# Patient Record
Sex: Female | Born: 1945 | Race: White | Hispanic: No | Smoking: Former smoker
Health system: Southern US, Community
[De-identification: ages and names within clinical notes are randomized; demographics above are authoritative.]

## PROBLEM LIST (undated history)

## (undated) DIAGNOSIS — K219 Gastro-esophageal reflux disease without esophagitis: Secondary | ICD-10-CM

## (undated) DIAGNOSIS — A809 Acute poliomyelitis, unspecified: Secondary | ICD-10-CM

## (undated) DIAGNOSIS — E119 Type 2 diabetes mellitus without complications: Secondary | ICD-10-CM

## (undated) DIAGNOSIS — C801 Malignant (primary) neoplasm, unspecified: Secondary | ICD-10-CM

## (undated) DIAGNOSIS — M1990 Unspecified osteoarthritis, unspecified site: Secondary | ICD-10-CM

## (undated) DIAGNOSIS — R1310 Dysphagia, unspecified: Secondary | ICD-10-CM

## (undated) DIAGNOSIS — Z860100 Personal history of colon polyps, unspecified: Secondary | ICD-10-CM

## (undated) DIAGNOSIS — G4730 Sleep apnea, unspecified: Secondary | ICD-10-CM

## (undated) DIAGNOSIS — G2581 Restless legs syndrome: Secondary | ICD-10-CM

## (undated) DIAGNOSIS — T753XXA Motion sickness, initial encounter: Secondary | ICD-10-CM

## (undated) HISTORY — PX: ESOPHAGOGASTRODUODENOSCOPY: SHX1529

## (undated) HISTORY — PX: COLONOSCOPY: SHX174

## (undated) HISTORY — PX: EYE SURGERY: SHX253

## (undated) HISTORY — PX: JOINT REPLACEMENT: SHX530

## (undated) HISTORY — PX: ENDOMETRIAL FULGURATION: SHX1500

---

## 2004-11-17 ENCOUNTER — Ambulatory Visit: Payer: Self-pay | Admitting: Unknown Physician Specialty

## 2005-07-03 ENCOUNTER — Ambulatory Visit: Payer: Self-pay | Admitting: General Practice

## 2006-09-12 ENCOUNTER — Ambulatory Visit: Payer: Self-pay | Admitting: Family Medicine

## 2007-09-24 ENCOUNTER — Ambulatory Visit: Payer: Self-pay | Admitting: Obstetrics and Gynecology

## 2008-09-27 ENCOUNTER — Ambulatory Visit: Payer: Self-pay

## 2008-10-08 ENCOUNTER — Ambulatory Visit: Payer: Self-pay | Admitting: Unknown Physician Specialty

## 2008-11-23 ENCOUNTER — Ambulatory Visit: Payer: Self-pay | Admitting: Unknown Physician Specialty

## 2008-12-13 ENCOUNTER — Ambulatory Visit: Payer: Self-pay | Admitting: Internal Medicine

## 2009-03-29 ENCOUNTER — Ambulatory Visit: Payer: Self-pay

## 2009-04-07 ENCOUNTER — Ambulatory Visit: Payer: Self-pay

## 2009-09-29 ENCOUNTER — Ambulatory Visit: Payer: Self-pay

## 2010-10-02 ENCOUNTER — Ambulatory Visit: Payer: Self-pay | Admitting: Family Medicine

## 2012-01-18 ENCOUNTER — Ambulatory Visit: Payer: Self-pay | Admitting: Family Medicine

## 2012-05-20 ENCOUNTER — Ambulatory Visit: Payer: Self-pay | Admitting: Family Medicine

## 2013-05-20 ENCOUNTER — Ambulatory Visit: Payer: Self-pay | Admitting: Family Medicine

## 2013-11-20 ENCOUNTER — Ambulatory Visit: Payer: Self-pay | Admitting: Unknown Physician Specialty

## 2013-12-28 ENCOUNTER — Ambulatory Visit: Payer: Self-pay | Admitting: Unknown Physician Specialty

## 2013-12-31 LAB — PATHOLOGY REPORT

## 2014-01-12 ENCOUNTER — Ambulatory Visit: Payer: Self-pay | Admitting: Otolaryngology

## 2014-01-21 ENCOUNTER — Ambulatory Visit: Payer: Self-pay | Admitting: Otolaryngology

## 2014-05-21 ENCOUNTER — Ambulatory Visit: Payer: Self-pay | Admitting: Family Medicine

## 2014-09-10 ENCOUNTER — Emergency Department: Payer: Self-pay | Admitting: Emergency Medicine

## 2015-06-05 ENCOUNTER — Ambulatory Visit
Admission: EM | Admit: 2015-06-05 | Discharge: 2015-06-05 | Disposition: A | Discharge status: Home / Self Care | Payer: Commercial Managed Care - HMO | Attending: Family Medicine | Admitting: Family Medicine

## 2015-06-05 DIAGNOSIS — L03312 Cellulitis of back [any part except buttock]: Secondary | ICD-10-CM | POA: Diagnosis not present

## 2015-06-05 HISTORY — DX: Gastro-esophageal reflux disease without esophagitis: K21.9

## 2015-06-05 HISTORY — DX: Restless legs syndrome: G25.81

## 2015-06-05 MED ORDER — DOXYCYCLINE HYCLATE 100 MG PO CAPS: 100.0000 mg | ORAL_CAPSULE | Freq: Two times a day (BID) | ORAL | Status: DC

## 2015-06-05 NOTE — Discharge Instructions (Signed)
°  Please notify dermatologist tomorrow or today's visit and findings- Return to [place] if you have increase difficulty with swelling/pain/red , fever not controlled by tylenol , Ibuprofen Or for re-evaluation if you have questions or concerns.   Cellulitis Cellulitis is an infection of the skin and the tissue under the skin. The infected area is usually red and tender. This happens most often in the arms and lower legs. HOME CARE   Take your antibiotic medicine as told. Finish the medicine even if you start to feel better.  Keep the infected arm or leg raised (elevated).  Put a warm cloth on the area up to 4 times per day.  Only take medicines as told by your doctor.  Keep all doctor visits as told. GET HELP IF:  You see red streaks on the skin coming from the infected area.  Your red area gets bigger or turns a dark color.  Your bone or joint under the infected area is painful after the skin heals.  Your infection comes back in the same area or different area.  You have a puffy (swollen) bump in the infected area.  You have new symptoms.  You have a fever. GET HELP RIGHT AWAY IF:   You feel very sleepy.  You throw up (vomit) or have watery poop (diarrhea).  You feel sick and have muscle aches and pains. MAKE SURE YOU:   Understand these instructions.  Will watch your condition.  Will get help right away if you are not doing well or get worse. Document Released: 05/21/2008 Document Revised: 04/19/2014 Document Reviewed: 02/18/2012 [facility] Patient Information 2015 [place], [place]. This information is not intended to replace advice given to you by your health care provider. Make sure you discuss any questions you have with your health care provider.

## 2015-06-05 NOTE — ED Notes (Signed)
States had ??basal cell?? Lesion removed right upper posterior back on Wednesday. Yesterday increased pain and fever of 101.7. Now 3 inches x 6 inches redness around wound, skin hot and firm to touch

## 2015-06-08 ENCOUNTER — Encounter: Payer: Self-pay | Admitting: Physician Assistant

## 2015-06-08 NOTE — ED Provider Notes (Signed)
CSN: [id number]     Arrival date & time 06/05/15  0846 History   First MD Initiated Contact with Patient 06/05/15 [phone]     Chief Complaint  Patient presents with  . Cellulitis   (Consider location/radiation/quality/duration/timing/severity/associated sxs/prior Treatment) HPI 69 yo F recently underwent excisional biopsies in Dermatology office. No post op checks scheduled. Having tenderness in area of incision on right scapula. Had fever last night of 101.7 and became very anxious. Dermatology office is closed today so her friend has brought her to see [place]  Past Medical History  Diagnosis Date  . GERD (gastroesophageal reflux disease)   . Restless leg syndrome    History reviewed. No pertinent past surgical history. Family History  Problem Relation Age of Onset  . Heart attack Mother   . Heart attack Father   . Diabetes Sister   . Diabetes Brother   . Cancer Brother    History  Substance Use Topics  . Smoking status: Former Research scientist (life sciences)  . Smokeless tobacco: Not on file  . Alcohol Use: No   OB History    No data available     Review of Systems Review of 10 systems negative for acute change except as referenced in HPI  Allergies  Sulfa antibiotics  Home Medications   Prior to Admission medications   Medication Sig Start Date End Date Taking? Authorizing Provider  omeprazole (PRILOSEC) 40 MG capsule Take 40 mg by mouth daily.   Yes Historical Provider, MD  pramipexole (MIRAPEX) 0.25 MG tablet Take 0.25 mg by mouth 3 (three) times daily.   Yes Historical Provider, MD  doxycycline (VIBRAMYCIN) 100 MG capsule Take 1 capsule (100 mg total) by mouth 2 (two) times daily. 06/05/15   Jan Fireman, PA-C   BP 142/66 mmHg  Pulse 92  Temp(Src) 98.1 F (36.7 C) (Oral)  Resp 17  Ht 5\' 4"  (1.626 m)  Wt 215 lb (97.523 kg)  BMI 36.89 kg/m2  SpO2 99% Physical Exam   HEENT - grossly WNL, atraumatic, conjugate gaze, normal hearing, clear voice Neck is supple without glandular  enlargement Lungs are clear, no respiratory distress Heart RSR without MGR Back - Subcuticular closure of surgical incision on right upper scapular region- steri-strips in place. Erythematous halo 7 x 10 cm surrounds incision, indurated moderately firm and warm to touch -tender Abd no distention Extremities FROM , ambulatory and self care Neuro intact   ED Course  Procedures (including critical care time) Labs Review Labs Reviewed - No data to display  Imaging Review No results found.   MDM   1. Cellulitis of back except buttock    Plan: 1. Diagnosis Cellulitis reviewed with patient and informational handout given 2. Rx antibiotics ( sulfa allergy) as per orders; risks, benefits, potential side effects reviewed with patient 3. Recommend supportive treatment with warm packs to incision; tylenol/ibuprofen for patient comfort and fever. 4. F/u prn if symptoms worsen or don't improve; contact Dermatology office on Monday with message regarding our visit and plan. 5. Questions fielded, expectations and recommendations reviewed. Patient expresses understanding. Will return to [facility] with questions, concern or exacerbation.   Discharge Medication List as of 06/05/2015 10:06 AM    START taking these medications   Details  doxycycline (VIBRAMYCIN) 100 MG capsule Take 1 capsule (100 mg total) by mouth 2 (two) times daily., Starting 06/05/2015, Until Discontinued, Print        Jan Fireman, PA-C 06/08/15 2035

## 2015-07-25 ENCOUNTER — Other Ambulatory Visit: Payer: Self-pay | Admitting: Family Medicine

## 2015-07-25 ENCOUNTER — Other Ambulatory Visit: Payer: Self-pay | Admitting: Unknown Physician Specialty

## 2015-07-25 DIAGNOSIS — M1712 Unilateral primary osteoarthritis, left knee: Secondary | ICD-10-CM

## 2015-07-25 DIAGNOSIS — Z1231 Encounter for screening mammogram for malignant neoplasm of breast: Secondary | ICD-10-CM

## 2015-07-27 ENCOUNTER — Ambulatory Visit
Admission: RE | Admit: 2015-07-27 | Discharge: 2015-07-27 | Disposition: A | Discharge status: Home / Self Care | Payer: Commercial Managed Care - HMO | Source: Ambulatory Visit | Attending: Family Medicine | Admitting: Family Medicine

## 2015-07-27 DIAGNOSIS — Z1231 Encounter for screening mammogram for malignant neoplasm of breast: Secondary | ICD-10-CM | POA: Insufficient documentation

## 2015-07-27 HISTORY — DX: Malignant (primary) neoplasm, unspecified: C80.1

## 2015-07-29 ENCOUNTER — Ambulatory Visit
Admission: RE | Admit: 2015-07-29 | Discharge: 2015-07-29 | Disposition: A | Discharge status: Home / Self Care | Payer: Commercial Managed Care - HMO | Source: Ambulatory Visit | Attending: Unknown Physician Specialty | Admitting: Unknown Physician Specialty

## 2015-07-29 DIAGNOSIS — X58XXXA Exposure to other specified factors, initial encounter: Secondary | ICD-10-CM | POA: Insufficient documentation

## 2015-07-29 DIAGNOSIS — M7122 Synovial cyst of popliteal space [Baker], left knee: Secondary | ICD-10-CM | POA: Diagnosis not present

## 2015-07-29 DIAGNOSIS — S83232A Complex tear of medial meniscus, current injury, left knee, initial encounter: Secondary | ICD-10-CM | POA: Diagnosis not present

## 2015-07-29 DIAGNOSIS — M1712 Unilateral primary osteoarthritis, left knee: Secondary | ICD-10-CM | POA: Insufficient documentation

## 2015-08-17 ENCOUNTER — Encounter: Payer: Self-pay | Admitting: *Deleted

## 2015-08-26 ENCOUNTER — Ambulatory Visit: Payer: Commercial Managed Care - HMO | Admitting: Anesthesiology

## 2015-08-26 ENCOUNTER — Encounter: Payer: Self-pay | Admitting: Anesthesiology

## 2015-08-26 ENCOUNTER — Encounter
Admission: RE | Disposition: A | Discharge status: Home / Self Care | Payer: Self-pay | Source: Ambulatory Visit | Attending: Unknown Physician Specialty

## 2015-08-26 ENCOUNTER — Ambulatory Visit
Admission: RE | Admit: 2015-08-26 | Discharge: 2015-08-26 | Disposition: A | Discharge status: Home / Self Care | Payer: Commercial Managed Care - HMO | Source: Ambulatory Visit | Attending: Unknown Physician Specialty | Admitting: Unknown Physician Specialty

## 2015-08-26 DIAGNOSIS — Z85820 Personal history of malignant melanoma of skin: Secondary | ICD-10-CM | POA: Insufficient documentation

## 2015-08-26 DIAGNOSIS — M1712 Unilateral primary osteoarthritis, left knee: Secondary | ICD-10-CM | POA: Diagnosis not present

## 2015-08-26 DIAGNOSIS — Z8612 Personal history of poliomyelitis: Secondary | ICD-10-CM | POA: Insufficient documentation

## 2015-08-26 DIAGNOSIS — M89762 Major osseous defect, left lower leg: Secondary | ICD-10-CM | POA: Insufficient documentation

## 2015-08-26 DIAGNOSIS — Z882 Allergy status to sulfonamides status: Secondary | ICD-10-CM | POA: Diagnosis not present

## 2015-08-26 DIAGNOSIS — M23204 Derangement of unspecified medial meniscus due to old tear or injury, left knee: Secondary | ICD-10-CM | POA: Diagnosis present

## 2015-08-26 DIAGNOSIS — Z87891 Personal history of nicotine dependence: Secondary | ICD-10-CM | POA: Diagnosis not present

## 2015-08-26 DIAGNOSIS — K219 Gastro-esophageal reflux disease without esophagitis: Secondary | ICD-10-CM | POA: Diagnosis not present

## 2015-08-26 DIAGNOSIS — M23222 Derangement of posterior horn of medial meniscus due to old tear or injury, left knee: Secondary | ICD-10-CM | POA: Insufficient documentation

## 2015-08-26 DIAGNOSIS — Z6837 Body mass index (BMI) 37.0-37.9, adult: Secondary | ICD-10-CM | POA: Diagnosis not present

## 2015-08-26 HISTORY — PX: KNEE ARTHROSCOPY: SHX127

## 2015-08-26 HISTORY — DX: Acute poliomyelitis, unspecified: A80.9

## 2015-08-26 HISTORY — DX: Unspecified osteoarthritis, unspecified site: M19.90

## 2015-08-26 SURGERY — ARTHROSCOPY, KNEE
Anesthesia: General | Laterality: Left | Wound class: Clean

## 2015-08-26 MED ORDER — LACTATED RINGERS IV SOLN
INTRAVENOUS | Status: DC
  Administered 2015-08-26: 08:00:00 via INTRAVENOUS

## 2015-08-26 MED ORDER — LIDOCAINE HCL (CARDIAC) 20 MG/ML IV SOLN
INTRAVENOUS | Status: DC | PRN
  Administered 2015-08-26: 40 mg via INTRATRACHEAL

## 2015-08-26 MED ORDER — PROPOFOL 10 MG/ML IV BOLUS
INTRAVENOUS | Status: DC | PRN
  Administered 2015-08-26: 200 mg via INTRAVENOUS

## 2015-08-26 MED ORDER — BUPIVACAINE HCL (PF) 0.5 % IJ SOLN
INTRAMUSCULAR | Status: DC | PRN
  Administered 2015-08-26: 10 mL

## 2015-08-26 MED ORDER — FENTANYL CITRATE (PF) 100 MCG/2ML IJ SOLN
INTRAMUSCULAR | Status: DC | PRN
  Administered 2015-08-26 (×2): 50 ug via INTRAVENOUS

## 2015-08-26 MED ORDER — OXYCODONE HCL 5 MG/5ML PO SOLN: 5.0000 mg | Freq: Once | ORAL | Status: AC | PRN

## 2015-08-26 MED ORDER — NORCO 5-325 MG PO TABS: 1.0000 | ORAL_TABLET | Freq: Four times a day (QID) | ORAL | Status: DC | PRN

## 2015-08-26 MED ORDER — MIDAZOLAM HCL 5 MG/5ML IJ SOLN
INTRAMUSCULAR | Status: DC | PRN
  Administered 2015-08-26: 2 mg via INTRAVENOUS

## 2015-08-26 MED ORDER — ONDANSETRON HCL 4 MG/2ML IJ SOLN
INTRAMUSCULAR | Status: DC | PRN
  Administered 2015-08-26: 4 mg via INTRAVENOUS

## 2015-08-26 MED ORDER — DEXAMETHASONE SODIUM PHOSPHATE 4 MG/ML IJ SOLN
INTRAMUSCULAR | Status: DC | PRN
  Administered 2015-08-26: 8 mg via INTRAVENOUS

## 2015-08-26 MED ORDER — OXYCODONE HCL 5 MG PO TABS
5.0000 mg | ORAL_TABLET | Freq: Once | ORAL | Status: AC | PRN
  Administered 2015-08-26: 5 mg via ORAL

## 2015-08-26 SURGICAL SUPPLY — 42 items
ARTHROWAND PARAGON T2 (SURGICAL WAND) ×3
BLADE FULL RADIUS 3.5 (BLADE) ×3 IMPLANT
BLADE SHAVER 4.5X7 STR FR (MISCELLANEOUS) ×3 IMPLANT
BUR RADIUS 3.5 (BURR) IMPLANT
BUR RADIUS 4.0X18.5 (BURR) IMPLANT
BUR ROUND 5.5 (BURR) IMPLANT
BURR ROUND 12 FLUTE 4.0MM (BURR) IMPLANT
COVER LIGHT HANDLE FLEXIBLE (MISCELLANEOUS) ×3 IMPLANT
CUFF TOURN SGL QUICK 24 (TOURNIQUET CUFF)
CUFF TOURN SGL QUICK 30 (MISCELLANEOUS)
CUFF TOURN SGL QUICK 34 (TOURNIQUET CUFF) ×2
CUFF TRNQT CYL 24X4X40X1 (TOURNIQUET CUFF) IMPLANT
CUFF TRNQT CYL 34X4X40X1 (TOURNIQUET CUFF) ×1 IMPLANT
CUFF TRNQT CYL LO 30X4X (MISCELLANEOUS) IMPLANT
CUTTER SLOTTED WHISKER 4.0 (BURR) IMPLANT
DRAPE LEGGINS SURG 28X43 STRL (DRAPES) ×3 IMPLANT
DURAPREP 26ML APPLICATOR (WOUND CARE) ×3 IMPLANT
GAUZE SPONGE 4X4 12PLY STRL (GAUZE/BANDAGES/DRESSINGS) ×3 IMPLANT
GLOVE BIO SURGEON STRL SZ7 (GLOVE) ×9 IMPLANT
GLOVE BIO SURGEON STRL SZ7.5 (GLOVE) ×3 IMPLANT
GLOVE BIO SURGEON STRL SZ8 (GLOVE) ×3 IMPLANT
GLOVE INDICATOR 8.0 STRL GRN (GLOVE) ×3 IMPLANT
GOWN STRL REIN 2XL XLG LVL4 (GOWN DISPOSABLE) ×3 IMPLANT
GOWN STRL REUS W/TWL 2XL LVL3 (GOWN DISPOSABLE) ×3 IMPLANT
IV LACTATED RINGER IRRG 3000ML (IV SOLUTION) ×4
IV LR IRRIG 3000ML ARTHROMATIC (IV SOLUTION) ×2 IMPLANT
MANIFOLD 4PT FOR NEPTUNE1 (MISCELLANEOUS) ×3 IMPLANT
PACK ARTHROSCOPY KNEE (MISCELLANEOUS) ×3 IMPLANT
SET TUBE SUCT SHAVER OUTFL 24K (TUBING) ×3 IMPLANT
SOL PREP PVP 2OZ (MISCELLANEOUS) ×3
SOLUTION PREP PVP 2OZ (MISCELLANEOUS) ×1 IMPLANT
SUT ETHILON 3-0 FS-10 30 BLK (SUTURE) ×3
SUTURE EHLN 3-0 FS-10 30 BLK (SUTURE) ×1 IMPLANT
TAPE MICROFOAM 4IN (TAPE) ×3 IMPLANT
TUBING ARTHRO INFLOW-ONLY STRL (TUBING) ×3 IMPLANT
WAND ARTHRO PARAGON T2 (SURGICAL WAND) ×1 IMPLANT
WAND COVAC 50 IFS (MISCELLANEOUS) IMPLANT
WAND HAND CNTRL MULTIVAC 50 (MISCELLANEOUS) ×3 IMPLANT
WAND HAND CNTRL MULTIVAC 90 (MISCELLANEOUS) IMPLANT
WAND MEGAVAC 90 (MISCELLANEOUS) IMPLANT
WAND ULTRAVAC 90 (MISCELLANEOUS) IMPLANT
WRAP KNEE W/COLD PACKS 25.5X14 (SOFTGOODS) ×3 IMPLANT

## 2015-08-26 NOTE — Anesthesia Procedure Notes (Signed)
Procedure Name: LMA Insertion Date/Time: 08/26/2015 8:49 AM Performed by: Londell Moh Pre-anesthesia Checklist: Patient identified, Emergency Drugs available, Suction available, Timeout performed and Patient being monitored Patient Re-evaluated:Patient Re-evaluated prior to inductionOxygen Delivery Method: Circle system utilized Preoxygenation: Pre-oxygenation with 100% oxygen Intubation Type: IV induction LMA: LMA inserted LMA Size: 4.0 Number of attempts: 1 Placement Confirmation: positive ETCO2 and breath sounds checked- equal and bilateral Tube secured with: Tape

## 2015-08-26 NOTE — H&P (Signed)
  H and P reviewed. No changes. Uploaded at later date. 

## 2015-08-26 NOTE — Op Note (Addendum)
Patient: Sherry Vaughan  Preoperative diagnosis: Torn medial meniscus and multiple chondral lesions  Postop diagnosis: Same  Operation: Arthroscopic partial medial meniscectomy plus debridement and Coblation of the medial femoral chondral lesion plus microfracture of the lateral femoral chondral lesion  Surgeon: Vilinda Flake, MD  Anesthesia: Gen.   History: Patient's had a long history of left knee pain.  The plain films revealed mild medial compartment narrowing .  The patient had an MRI which revealed torn medial meniscus plus multiple chondral lesions.The patient was scheduled for surgery due to persistent discomfort despite conservative treatment.  The patient was taken the operating room where satisfactory general anesthesia was achieved. A tourniquet and leg holder were was applied to the left thigh. A well leg support was applied to the nonoperative extremity. The left knee was prepped and draped in usual fashion for an arthroscopic procedure. An inflow cannula was introduced superomedially. The joint was distended with lactated Ringer's. Scope was introduced through an inferolateral puncture wound and a probe through an inferomedial puncture wound. Inspection of the medial compartment revealed  degenerative tear of the posterior horn of the medial meniscus along with a grade 3 chondral lesion in the mid weightbearing portion of the medial femoral condyle. I went ahead and resected the degenerative tear of the posterior horn of the medial meniscus with a combination of basket biters and a motorized resector. The remaining rim was contoured with an angled ArthroCare wand.. The medial femoral chondral lesion was debrided with a turbo whisker blade and then coblated with a Paragon wand. Inspection of the intercondylar notch revealed normal cruciates. Inspection of the the lateral compartment revealed a small grade 3 chondral lesion in the mid weightbearing portion of the lateral femoral  condyle. This lesion measured only about 4-5 mm in diameter.  I went ahead and debrided the lateral femoral chondral lesion with a turbo whisker blade. I then coblated the lesion with an angled ArthroCare Paragon wand. There was also a small nodular fibrous mass just lateral to the insertion of the anterior cruciate ligament onto the proximal tibia. This soft mass was removed with a grasper..  Trochlear groove was inspected and appeared to be fairly smooth.  Retropatellar surface was slightly fibrillated.The patella seemed to track fairly well.  The instruments were removed from the joint at this time. The puncture wounds were closed with 3-0 nylon in vertical mattress fashion. I injected each puncture wound with several cc of half percent Marcaine without epinephrine. Betadine was applied the wounds followed by sterile dressing. An ice pack was applied to the right knee. The patient was awakened and transferred to the stretcher bed. The patient was taken to the recovery room in satisfactory condition.  The tourniquet was not inflated during the course of the procedure. Blood loss was negligible.

## 2015-08-26 NOTE — Anesthesia Postprocedure Evaluation (Signed)
  Anesthesia Post-op Note  Patient: Sherry Vaughan  Procedure(s) Performed: Procedure(s): ARTHROSCOPY KNEE, partial medial meniscectomy and chondral debridement (Left)  Anesthesia type:General  Patient location: PACU  Post pain: Pain level controlled  Post assessment: Post-op Vital signs reviewed, Patient's Cardiovascular Status Stable, Respiratory Function Stable, Patent Airway and No signs of Nausea or vomiting  Post vital signs: Reviewed and stable  Last Vitals:  Filed Vitals:   08/26/15 1056  BP: 142/73  Pulse: 63  Temp:   Resp: 13    Level of consciousness: awake, alert  and patient cooperative  Complications: No apparent anesthesia complications

## 2015-08-26 NOTE — Transfer of Care (Signed)
Immediate Anesthesia Transfer of Care Note  Patient: Sherry Vaughan  Procedure(s) Performed: Procedure(s): ARTHROSCOPY KNEE, partial medial meniscectomy and chondral debridement (Left)  Patient Location: PACU  Anesthesia Type: General  Level of Consciousness: awake, alert  and patient cooperative  Airway and Oxygen Therapy: Patient Spontanous Breathing and Patient connected to supplemental oxygen  Post-op Assessment: Post-op Vital signs reviewed, Patient's Cardiovascular Status Stable, Respiratory Function Stable, Patent Airway and No signs of Nausea or vomiting  Post-op Vital Signs: Reviewed and stable  Complications: No apparent anesthesia complications

## 2015-08-26 NOTE — Anesthesia Preprocedure Evaluation (Signed)
Anesthesia Evaluation  Patient identified by MRN, date of birth, ID band  Reviewed: NPO status   History of Anesthesia Complications Negative for: history of anesthetic complications  Airway Mallampati: II  TM Distance: >3 FB Neck ROM: full    Dental no notable dental hx.    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  negative psych ROS   GI/Hepatic Neg liver ROS, GERD  Controlled,  Endo/Other  Morbid obesity (bmi 37)  Renal/GU negative Renal ROS  negative genitourinary   Musculoskeletal  (+) Arthritis ,   Abdominal   Peds  Hematology Melanoma    Anesthesia Other Findings   Reproductive/Obstetrics                             Anesthesia Physical Anesthesia Plan  ASA: II  Anesthesia Plan: General   Post-op Pain Management:    Induction:   Airway Management Planned:   Additional Equipment:   Intra-op Plan:   Post-operative Plan:   Informed Consent: I have reviewed the patients History and Physical, chart, labs and discussed the procedure including the risks, benefits and alternatives for the proposed anesthesia with the patient or authorized representative who has indicated his/her understanding and acceptance.     Plan Discussed with: CRNA  Anesthesia Plan Comments:         Anesthesia Quick Evaluation

## 2015-08-26 NOTE — Discharge Instructions (Signed)
General Anesthesia, Care After °Refer to this sheet in the next few weeks. These instructions provide you with information on caring for yourself after your procedure. Your health care provider may also give you more specific instructions. Your treatment has been planned according to current medical practices, but problems sometimes occur. Call your health care provider if you have any problems or questions after your procedure. °WHAT TO EXPECT AFTER THE PROCEDURE °After the procedure, it is typical to experience: °· Sleepiness. °· Nausea and vomiting. °HOME CARE INSTRUCTIONS °· For the first 24 hours after general anesthesia: °¨ Have a responsible person with you. °¨ Do not drive a car. If you are alone, do not take public transportation. °¨ Do not drink alcohol. °¨ Do not take medicine that has not been prescribed by your health care provider. °¨ Do not sign important papers or make important decisions. °¨ You may resume a normal diet and activities as directed by your health care provider. °· Change bandages (dressings) as directed. °· If you have questions or problems that seem related to general anesthesia, call the hospital and ask for the anesthetist or anesthesiologist on call. °SEEK MEDICAL CARE IF: °· You have nausea and vomiting that continue the day after anesthesia. °· You develop a rash. °SEEK IMMEDIATE MEDICAL CARE IF:  °· You have difficulty breathing. °· You have chest pain. °· You have any allergic problems. °Document Released: 03/11/2001 Document Revised: 12/08/2013 Document Reviewed: 06/18/2013 °[employer]® Patient Information ©2015 [employer]. This information is not intended to replace advice given to you by your health care provider. Make sure you discuss any questions you have with your health care provider. ° ° °[facility]  °Allaina Brotzman B. Juliet Vasbinder, Jr., M.D. [phone]  ° °KNEE ARTHROSCOPY POST OPERATION INSTRUCTIONS: ° °PLEASE READ THESE INSTRUCTIONS  ABOUT POST OPERATION CARE. THEY WILL ANSWER MOST OF YOUR QUESTIONS.  °You have been given a prescription for pain. Please take as directed for pain.  °You can walk, keeping the knee slightly stiff-avoid doing too much bending the first day. (if ACL reconstruction is performed, keep brace locked in extension when walking.)  °You will use crutches or cane if needed. Can weight bear as tolerated  °Plan to take three to four days off from work. You can resume work when you are comfortable. (This can be a week or more, depending on the type of work you do.)  °To reduce pain and swelling, place one to two pillows under the knee the first two or three days when sitting or lying. An ice pack may be placed on top of the area over the dressing. Instructions for making homemade icepack are as follow:  °Flexible homemade alcohol water ice pack  °2 cups water  °1 cup rubbing alcohol  °food coloring for the blue tint (optional)  °2 zip-top bags - gallon-size  °Mix the water and alcohol together in one of your zip-top bags and add food coloring. Release as much air as possible and seal the bag. Place in freezer for at least 12 hours.  °The small incisions in your knee are closed with nylon stitches. They will be removed in the office.  °The bulky dressing may be removed in the third day after surgery. (If ACL surgery-DO NOT REMOVE BANDAGES). Put a waterproof band-aid over each stitch. Do not put any creams or ointments on wounds. You may shower at this time, but change waterproof band-aids after showering. KEEP INCISIONS CLEAN AND DRY UNTIL YOU RETURN TO   THE OFFICE.  °Sometimes the operative area remains somewhat painful and swollen for several weeks. This is usually nothing to worry about, but call if you have any excessive symptoms, especially fever. It is not unusual to have a low grade fever of 99 degrees for the first few days. If persist after 3-4 days call the office. It is not uncommon for the pain to be a little worse on  the third day after surgery.  °Begin doing gentle exercises right away. They will be limited by the amount of pain and swelling you have.  Exercising will reduce the swelling, increase motion, and prevent muscle weakness. Exercises: Straight leg raising and gentle knee bending.  °Take 81 milligram aspirin twice a day for 2 weeks after meals or milk. This along with elevation will help reduce the possibility of phlebitis in your operated leg.  °Avoid strenuous athletics for a minimum of 4 to 6 weeks after arthroscopic surgery (approximately five months if ACL surgery).  °If the surgery included ACL reconstruction the brace that is supplied to the extremity post surgery is to be locked in extension when you are asleep and is to be locked in extension when you are ambulating. It can be unlocked for exercises or sitting.  °Keep your post surgery appointment that has been made for you. If you do not remember the date call [phone]. Your follow up appointment should be between 7-10 days.  ° °

## 2015-08-29 ENCOUNTER — Encounter: Payer: Self-pay | Admitting: Unknown Physician Specialty

## 2016-07-27 ENCOUNTER — Other Ambulatory Visit: Payer: Self-pay | Admitting: Nurse Practitioner

## 2016-07-27 DIAGNOSIS — Z1231 Encounter for screening mammogram for malignant neoplasm of breast: Secondary | ICD-10-CM

## 2016-08-06 ENCOUNTER — Ambulatory Visit
Admission: RE | Admit: 2016-08-06 | Discharge: 2016-08-06 | Disposition: A | Discharge status: Home / Self Care | Payer: Medicare HMO | Source: Ambulatory Visit | Attending: Nurse Practitioner | Admitting: Nurse Practitioner

## 2016-08-06 DIAGNOSIS — Z1231 Encounter for screening mammogram for malignant neoplasm of breast: Secondary | ICD-10-CM | POA: Diagnosis not present

## 2017-03-01 ENCOUNTER — Encounter: Payer: Self-pay | Admitting: *Deleted

## 2017-03-04 ENCOUNTER — Encounter: Payer: Self-pay | Admitting: *Deleted

## 2017-03-04 ENCOUNTER — Ambulatory Visit: Payer: Medicare HMO | Admitting: Anesthesiology

## 2017-03-04 ENCOUNTER — Ambulatory Visit
Admission: RE | Admit: 2017-03-04 | Discharge: 2017-03-04 | Disposition: A | Discharge status: Home / Self Care | Payer: Medicare HMO | Source: Ambulatory Visit | Attending: Unknown Physician Specialty | Admitting: Unknown Physician Specialty

## 2017-03-04 ENCOUNTER — Encounter
Admission: RE | Disposition: A | Discharge status: Home / Self Care | Payer: Self-pay | Source: Ambulatory Visit | Attending: Unknown Physician Specialty

## 2017-03-04 DIAGNOSIS — K648 Other hemorrhoids: Secondary | ICD-10-CM | POA: Insufficient documentation

## 2017-03-04 DIAGNOSIS — Z7982 Long term (current) use of aspirin: Secondary | ICD-10-CM | POA: Diagnosis not present

## 2017-03-04 DIAGNOSIS — K2950 Unspecified chronic gastritis without bleeding: Secondary | ICD-10-CM | POA: Insufficient documentation

## 2017-03-04 DIAGNOSIS — Z85820 Personal history of malignant melanoma of skin: Secondary | ICD-10-CM | POA: Diagnosis not present

## 2017-03-04 DIAGNOSIS — Z8612 Personal history of poliomyelitis: Secondary | ICD-10-CM | POA: Insufficient documentation

## 2017-03-04 DIAGNOSIS — Z86010 Personal history of colonic polyps: Secondary | ICD-10-CM | POA: Diagnosis not present

## 2017-03-04 DIAGNOSIS — R1310 Dysphagia, unspecified: Secondary | ICD-10-CM | POA: Insufficient documentation

## 2017-03-04 DIAGNOSIS — Z803 Family history of malignant neoplasm of breast: Secondary | ICD-10-CM | POA: Diagnosis not present

## 2017-03-04 DIAGNOSIS — Z87891 Personal history of nicotine dependence: Secondary | ICD-10-CM | POA: Insufficient documentation

## 2017-03-04 DIAGNOSIS — Z79899 Other long term (current) drug therapy: Secondary | ICD-10-CM | POA: Diagnosis not present

## 2017-03-04 DIAGNOSIS — K219 Gastro-esophageal reflux disease without esophagitis: Secondary | ICD-10-CM | POA: Diagnosis not present

## 2017-03-04 DIAGNOSIS — M170 Bilateral primary osteoarthritis of knee: Secondary | ICD-10-CM | POA: Diagnosis not present

## 2017-03-04 DIAGNOSIS — E119 Type 2 diabetes mellitus without complications: Secondary | ICD-10-CM | POA: Insufficient documentation

## 2017-03-04 DIAGNOSIS — Z809 Family history of malignant neoplasm, unspecified: Secondary | ICD-10-CM | POA: Diagnosis not present

## 2017-03-04 DIAGNOSIS — Z1211 Encounter for screening for malignant neoplasm of colon: Secondary | ICD-10-CM | POA: Diagnosis present

## 2017-03-04 DIAGNOSIS — Z833 Family history of diabetes mellitus: Secondary | ICD-10-CM | POA: Insufficient documentation

## 2017-03-04 HISTORY — PX: ESOPHAGOGASTRODUODENOSCOPY (EGD) WITH PROPOFOL: SHX5813

## 2017-03-04 HISTORY — DX: Personal history of colon polyps, unspecified: Z86.0100

## 2017-03-04 HISTORY — DX: Personal history of colonic polyps: Z86.010

## 2017-03-04 HISTORY — DX: Dysphagia, unspecified: R13.10

## 2017-03-04 HISTORY — DX: Type 2 diabetes mellitus without complications: E11.9

## 2017-03-04 HISTORY — PX: COLONOSCOPY WITH PROPOFOL: SHX5780

## 2017-03-04 SURGERY — COLONOSCOPY WITH PROPOFOL
Anesthesia: General

## 2017-03-04 MED ORDER — FENTANYL CITRATE (PF) 100 MCG/2ML IJ SOLN
INTRAMUSCULAR | Status: AC
  Filled 2017-03-04: qty 2

## 2017-03-04 MED ORDER — MIDAZOLAM HCL 5 MG/5ML IJ SOLN
INTRAMUSCULAR | Status: DC | PRN
  Administered 2017-03-04 (×2): 0.5 mg via INTRAVENOUS
  Administered 2017-03-04: 1 mg via INTRAVENOUS

## 2017-03-04 MED ORDER — LIDOCAINE HCL (PF) 2 % IJ SOLN
INTRAMUSCULAR | Status: DC | PRN
  Administered 2017-03-04: 50 mg

## 2017-03-04 MED ORDER — SODIUM CHLORIDE 0.9 % IV SOLN
INTRAVENOUS | Status: DC
  Administered 2017-03-04: 1000 mL via INTRAVENOUS

## 2017-03-04 MED ORDER — SODIUM CHLORIDE 0.9 % IV SOLN: INTRAVENOUS | Status: DC

## 2017-03-04 MED ORDER — PROPOFOL 500 MG/50ML IV EMUL
INTRAVENOUS | Status: DC | PRN
  Administered 2017-03-04: 50 ug/kg/min via INTRAVENOUS

## 2017-03-04 MED ORDER — GLYCOPYRROLATE 0.2 MG/ML IJ SOLN
INTRAMUSCULAR | Status: DC | PRN
  Administered 2017-03-04: 0.2 mg via INTRAVENOUS

## 2017-03-04 MED ORDER — MIDAZOLAM HCL 2 MG/2ML IJ SOLN
INTRAMUSCULAR | Status: AC
  Filled 2017-03-04: qty 2

## 2017-03-04 MED ORDER — FENTANYL CITRATE (PF) 100 MCG/2ML IJ SOLN
INTRAMUSCULAR | Status: DC | PRN
  Administered 2017-03-04: 50 ug via INTRAVENOUS
  Administered 2017-03-04 (×2): 25 ug via INTRAVENOUS

## 2017-03-04 MED ORDER — GLYCOPYRROLATE 0.2 MG/ML IJ SOLN
INTRAMUSCULAR | Status: AC
  Filled 2017-03-04: qty 1

## 2017-03-04 MED ORDER — PROPOFOL 500 MG/50ML IV EMUL
INTRAVENOUS | Status: AC
  Filled 2017-03-04: qty 50

## 2017-03-04 MED ORDER — PROPOFOL 10 MG/ML IV BOLUS
INTRAVENOUS | Status: DC | PRN
Start: 2017-03-04 — End: 2017-03-04
  Administered 2017-03-04: 50 mg via INTRAVENOUS

## 2017-03-04 NOTE — Anesthesia Preprocedure Evaluation (Signed)
Anesthesia Evaluation  Patient identified by MRN, date of birth, ID band Patient awake    Reviewed: Allergy & Precautions, NPO status , Patient's Chart, lab work & pertinent test results, reviewed documented beta blocker date and time   Airway Mallampati: III  TM Distance: >3 FB     Dental  (+) Chipped   Pulmonary former smoker,           Cardiovascular      Neuro/Psych    GI/Hepatic   Endo/Other  diabetes  Renal/GU      Musculoskeletal  (+) Arthritis ,   Abdominal   Peds  Hematology   Anesthesia Other Findings   Reproductive/Obstetrics                             Anesthesia Physical Anesthesia Plan  ASA: III  Anesthesia Plan: General   Post-op Pain Management:    Induction: Intravenous  Airway Management Planned:   Additional Equipment:   Intra-op Plan:   Post-operative Plan:   Informed Consent: I have reviewed the patients History and Physical, chart, labs and discussed the procedure including the risks, benefits and alternatives for the proposed anesthesia with the patient or authorized representative who has indicated his/her understanding and acceptance.     Plan Discussed with: CRNA  Anesthesia Plan Comments:         Anesthesia Quick Evaluation

## 2017-03-04 NOTE — Anesthesia Post-op Follow-up Note (Cosign Needed)
Anesthesia QCDR form completed.        

## 2017-03-04 NOTE — Anesthesia Postprocedure Evaluation (Signed)
Anesthesia Post Note  Patient: Sherry Vaughan  Procedure(s) Performed: Procedure(s) (LRB): COLONOSCOPY WITH PROPOFOL (N/A) ESOPHAGOGASTRODUODENOSCOPY (EGD) WITH PROPOFOL (N/A)  Patient location during evaluation: Endoscopy Anesthesia Type: General Level of consciousness: awake and alert Pain management: pain level controlled Vital Signs Assessment: post-procedure vital signs reviewed and stable Respiratory status: spontaneous breathing, nonlabored ventilation, respiratory function stable and patient connected to nasal cannula oxygen Cardiovascular status: blood pressure returned to baseline and stable Postop Assessment: no signs of nausea or vomiting Anesthetic complications: no     Last Vitals:  Vitals:   03/04/17 1027 03/04/17 1037  BP: 134/71 136/78  Pulse: 78 73  Resp: 12 15  Temp:      Last Pain:  Vitals:   03/04/17 1007  TempSrc: Tympanic                 Katrese Shell S

## 2017-03-04 NOTE — Op Note (Signed)
[facility] Gastroenterology Patient Name: Sherry Vaughan Procedure Date: 03/04/2017 9:28 AM MRN: [id number] Account #: [id number] Date of Birth: 24-Jun-1946 Admit Type: Outpatient Age: 71 Room: [facility] ENDO ROOM 1 Gender: Female Note Status: Finalized Procedure:            Colonoscopy Indications:          High risk colon cancer surveillance: Personal history                        of colonic polyps Providers:            Manya Silvas, MD Referring MD:         Sofie Hartigan (Referring MD) Medicines:            Propofol per Anesthesia Complications:        No immediate complications. Procedure:            Pre-Anesthesia Assessment:                       - After reviewing the risks and benefits, the patient                        was deemed in satisfactory condition to undergo the                        procedure.                       After obtaining informed consent, the colonoscope was                        passed under direct vision. Throughout the procedure,                        the patient's blood pressure, pulse, and oxygen                        saturations were monitored continuously. The                        Colonoscope was introduced through the anus and                        advanced to the the cecum, identified by appendiceal                        orifice and ileocecal valve. The colonoscopy was                        performed without difficulty. The patient tolerated the                        procedure well. The quality of the bowel preparation                        was excellent. Findings:      Internal hemorrhoids were found during endoscopy. The hemorrhoids were       small-medium and Grade I (internal hemorrhoids that do not prolapse).      The exam was otherwise without abnormality. Impression:           - Internal  hemorrhoids.                       - The examination was otherwise normal.                       - No specimens  collected. Recommendation:       - The findings and recommendations were discussed with                        the patient's family. Manya Silvas, MD 03/04/2017 10:04:53 AM This report has been signed electronically. Number of Addenda: 0 Note Initiated On: 03/04/2017 9:28 AM Scope Withdrawal Time: 0 hours 5 minutes 24 seconds  Total Procedure Duration: 0 hours 13 minutes 35 seconds       [facility]

## 2017-03-04 NOTE — H&P (Signed)
Primary Care Physician:  [facility], MD Primary Gastroenterologist:  Dr. Vira Agar  Pre-Procedure History & Physical: HPI:  Sherry Vaughan is a 71 y.o. female is here for an endoscopy and colonoscopy.   Past Medical History:  Diagnosis Date  . Arthritis    knees  . Cancer [facility])    melanoma/skin ca  . Diabetes mellitus without complication ([place])   . Dysphagia   . GERD (gastroesophageal reflux disease)   . History of colon polyps   . 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  . Restless leg syndrome   . Restless leg syndrome     Past Surgical History:  Procedure Laterality Date  . COLONOSCOPY    . ENDOMETRIAL FULGURATION    . ESOPHAGOGASTRODUODENOSCOPY    . KNEE ARTHROSCOPY Left 08/26/2015   Procedure: ARTHROSCOPY KNEE, partial medial meniscectomy and chondral debridement;  Surgeon: Leanor Kail, MD;  Location: [place];  Service: Orthopedics;  Laterality: Left;    Prior to Admission medications   Medication Sig Start Date End Date Taking? Authorizing Provider  vitamin B-12 (CYANOCOBALAMIN) 100 MCG tablet Take 100 mcg by mouth daily.   Yes Historical Provider, MD  Acetaminophen (ARTHRITIS PAIN RELIEF PO) Take by mouth.    Historical Provider, MD  aspirin 81 MG tablet Take 81 mg by mouth daily.    Historical Provider, MD  Multiple Vitamins-Minerals (MULTIVITAMIN PO) Take by mouth.    Historical Provider, MD  omeprazole (PRILOSEC) 40 MG capsule Take 40 mg by mouth daily.    Historical Provider, MD    Allergies as of 01/21/2017 - Review Complete 08/26/2015  Allergen Reaction Noted  . Sulfa antibiotics Rash 06/05/2015    Family History  Problem Relation Age of Onset  . Heart attack Mother   . Heart attack Father   . Diabetes Sister   . Diabetes Brother   . Cancer Brother   . Breast cancer Maternal Aunt   . Breast cancer Paternal Aunt     Social History   Social History  . Marital status: Widowed    Spouse name: N/A  . Number of children: N/A  . Years of  education: N/A   Occupational History  . Not on file.   Social History Main Topics  . Smoking status: Former Smoker    Quit date: 12/17/1981  . Smokeless tobacco: Never Used  . Alcohol use Yes  . Drug use: No  . Sexual activity: Not on file   Other Topics Concern  . Not on file   Social History Narrative  . No narrative on file    Review of Systems: See HPI, otherwise negative ROS  Physical Exam: BP 128/72   Pulse 83   Temp 97.1 F (36.2 C) (Tympanic)   Resp 18   Ht 5\' 4"  (1.626 m)   Wt 97.1 kg (214 lb)   SpO2 100%   BMI 36.73 kg/m  General:   Alert,  pleasant and cooperative in NAD Head:  Normocephalic and atraumatic. Neck:  Supple; no masses or thyromegaly. Lungs:  Clear throughout to auscultation.    Heart:  Regular rate and rhythm. Abdomen:  Soft, nontender and nondistended. Normal bowel sounds, without guarding, and without rebound.   Neurologic:  Alert and  oriented x4;  grossly normal neurologically.  Impression/Plan: Sherry Vaughan is here for an endoscopy and colonoscopy to be performed for [facility] colon polyps and dysphagia for food.  Risks, benefits, limitations, and alternatives regarding  endoscopy and colonoscopy have been reviewed  with the patient.  Questions have been answered.  All parties agreeable.   Sherry Cheers, MD  03/04/2017, 9:30 AM

## 2017-03-04 NOTE — Transfer of Care (Signed)
Immediate Anesthesia Transfer of Care Note  Patient: Sherry Vaughan  Procedure(s) Performed: Procedure(s): COLONOSCOPY WITH PROPOFOL (N/A) ESOPHAGOGASTRODUODENOSCOPY (EGD) WITH PROPOFOL (N/A)  Patient Location: PACU  Anesthesia Type:General  Level of Consciousness: sedated  Airway & Oxygen Therapy: Patient Spontanous Breathing and Patient connected to nasal cannula oxygen  Post-op Assessment: Report given to RN and Post -op Vital signs reviewed and stable  Post vital signs: Reviewed and stable  Last Vitals:  Vitals:   03/04/17 0906 03/04/17 1007  BP: 128/72 115/64  Pulse: 83 78  Resp: 18 15  Temp: 36.2 C (!) 35.7 C    Last Pain:  Vitals:   03/04/17 1007  TempSrc: Tympanic         Complications: No apparent anesthesia complications

## 2017-03-04 NOTE — Op Note (Signed)
[facility]astroenter[facility] Patient Name: Sherry Vaughan Procedure Date: 03/04/2017 9:29 AM MRN: [id number] Account #: [id number] Date of Birth: 06-15-1946 Admit Type: Outpatient Age: 71 Room: [facility] ENDO ROOM 1 Gender: Female Note Status: Finalized Procedure:            Upper GI endoscopy Indications:          Dysphagia Providers:            Manya Silvas, MD Referring MD:         Sofie Hartigan (Referring MD) Medicines:            Propofol per Anesthesia Complications:        No immediate complications. Procedure:            Pre-Anesthesia Assessment:                       - After reviewing the risks and benefits, the patient                        was deemed in satisfactory condition to undergo the                        procedure.                       After obtaining informed consent, the endoscope was                        passed under direct vision. Throughout the procedure,                        the patient's blood pressure, pulse, and oxygen                        saturations were monitored continuously. The Endoscope                        was introduced through the mouth, and advanced to the                        second part of duodenum. The upper GI endoscopy was                        accomplished without difficulty. The patient tolerated                        the procedure well. Findings:      The examined esophagus was normal. At the end of the procedure I passed       a guide wire into the duodenum and removed the scope and passed a 58F       Savary dilator without significant resistance      Localized mildly erythematous mucosa without bleeding was found in the       gastric antrum. Biopsies were taken with a cold forceps for histology.       Biopsies were taken with a cold forceps for Helicobacter pylori testing.      The examined duodenum was normal. Impression:           - Normal esophagus.                       -  Erythematous  mucosa in the antrum. Biopsied.                       - Normal examined duodenum. Recommendation:       - Await pathology results. Manya Silvas, MD 03/04/2017 9:47:05 AM This report has been signed electronically. Number of Addenda: 0 Note Initiated On: 03/04/2017 9:29 AM      [facility]

## 2017-03-05 ENCOUNTER — Encounter: Payer: Self-pay | Admitting: Unknown Physician Specialty

## 2017-03-05 LAB — SURGICAL PATHOLOGY

## 2017-07-29 ENCOUNTER — Other Ambulatory Visit: Payer: Self-pay | Admitting: Family Medicine

## 2017-07-29 DIAGNOSIS — Z1231 Encounter for screening mammogram for malignant neoplasm of breast: Secondary | ICD-10-CM

## 2017-08-28 ENCOUNTER — Ambulatory Visit
Admission: RE | Admit: 2017-08-28 | Discharge: 2017-08-28 | Disposition: A | Discharge status: Home / Self Care | Payer: Medicare HMO | Source: Ambulatory Visit | Attending: Family Medicine | Admitting: Family Medicine

## 2017-08-28 DIAGNOSIS — Z1231 Encounter for screening mammogram for malignant neoplasm of breast: Secondary | ICD-10-CM | POA: Diagnosis present

## 2017-11-26 ENCOUNTER — Encounter: Payer: Self-pay | Admitting: *Deleted

## 2017-11-26 ENCOUNTER — Other Ambulatory Visit: Payer: Self-pay

## 2017-11-28 NOTE — Discharge Instructions (Signed)
INSTRUCTIONS FOLLOWING OCULOPLASTIC SURGERY °AMY M. FOWLER, MD ° °AFTER YOUR EYE SURGERY, THER ARE MANY THINGS THWIHC YOU, THE PATIENT, CAN DO TO ASSURE THE BEST POSSIBLE RESULT FROM YOUR OPERATION.  THIS SHEET SHOULD BE REFERRED TO WHENEVER QUESTIONS ARISE.  IF THERE ARE ANY QUESTIONS NOT ANSWERED HERE, DO NOT HESITATE TO CALL OUR OFFICE AT [phone] OR [phone].  THERE IS ALWAYS [facility] AVAILABLE TO CALL IF QUESTIONS OR PROBLEMS ARISE. ° °VISION: Your vision may be blurred and out of focus after surgery until you are able to stop using your ointment, swelling resolves and your eye(s) heal. This may take 1 to 2 weeks at the least.  If your vision becomes gradually more dim or dark, this is not normal and you need to call our office immediately. ° °EYE CARE: For the first 48 hours after surgery, use ice packs frequently - “20 minutes on, 20 minutes off” - to help reduce swelling and bruising.  Small bags of frozen peas or corn make good ice packs along with cloths soaked in ice water.  If you are wearing a patch or other type of dressing following surgery, keep this on for the amount of time specified by your doctor.  For the first week following surgery, you will need to treat your stitches with great care.  If is OK to shower, but take care to not allow soapy water to run into your eye(s) to help reduce changes of infection.  You may gently clean the eyelashes and around the eye(s) with cotton balls and sterile water, BUT DO NOT RUB THE STITCHES VIGOROUSLY.  Keeping your stitches moist with ointment will help promote healing with minimal scar formation. ° °ACTIVITY: When you leave the surgery center, you should go home, rest and be inactive.  The eye(s) may feel scratchy and keeping the eyes closed will allow for faster healing.  The first week following surgery, avoid straining (anything making the face turn red) or lifting over 20 pounds.  Additionally, avoid bending which causes your head to go below  your waist.  Using your eyes will NOT harm them, so feel free to read, watch television, use the computer, etc as desired.  Driving depends on each individual, so check with your doctor if you have questions about driving. ° °MEDICATIONS:  You will be given a prescription for an ointment to use 4 times a day on your stitches.  You can use the ointment in your eyes if they feel scratchy or irritated.  If you eyelid(s) don’t close completely when you sleep, put some ointment in your eyes before bedtime. ° °EMERGENCY: If you experience SEVERE EYE PAIN OR HEADACHE UNRELIEVED BY TYLENOL OR PERCOCET, NAUSEA OR VOMITING, WORSENING REDNESS, OR WORSENING VISION (ESPECIALLY VISION THAT WA INITIALLY BETTER) CALL [phone] OR [phone] DURING BUSINESS HOURS OR AFTER HOURS. ° °General Anesthesia, Adult, Care After °These instructions provide you with information about caring for yourself after your procedure. Your health care provider may also give you more specific instructions. Your treatment has been planned according to current medical practices, but problems sometimes occur. Call your health care provider if you have any problems or questions after your procedure. °What can I expect after the procedure? °After the procedure, it is common to have: °· Vomiting. °· A sore throat. °· Mental slowness. ° °It is common to feel: °· Nauseous. °· Cold or shivery. °· Sleepy. °· Tired. °· Sore or achy, even in parts of your body where you did not have surgery. ° °  Follow these instructions at home: °For at least 24 hours after the procedure: °· Do not: °? Participate in activities where you could fall or become injured. °? Drive. °? Use heavy machinery. °? Drink alcohol. °? Take sleeping pills or medicines that cause drowsiness. °? Make important decisions or sign legal documents. °? Take care of children on your own. °· Rest. °Eating and drinking °· If you vomit, drink water, juice, or soup when you can drink without  vomiting. °· Drink enough fluid to keep your urine clear or pale yellow. °· Make sure you have little or no nausea before eating solid foods. °· Follow the diet recommended by your health care provider. °General instructions °· Have a responsible adult stay with you until you are awake and alert. °· Return to your normal activities as told by your health care provider. Ask your health care provider what activities are safe for you. °· Take over-the-counter and prescription medicines only as told by your health care provider. °· If you smoke, do not smoke without supervision. °· Keep all follow-up visits as told by your health care provider. This is important. °Contact a health care provider if: °· You continue to have nausea or vomiting at home, and medicines are not helpful. °· You cannot drink fluids or start eating again. °· You cannot urinate after 8-12 hours. °· You develop a skin rash. °· You have fever. °· You have increasing redness at the site of your procedure. °Get help right away if: °· You have difficulty breathing. °· You have chest pain. °· You have unexpected bleeding. °· You feel that you are having a life-threatening or urgent problem. °This information is not intended to replace advice given to you by your health care provider. Make sure you discuss any questions you have with your health care provider. °Document Released: 03/11/2001 Document Revised: 05/07/2016 Document Reviewed: 11/17/2015 °Elsevier Interactive Patient Education © 2018 [employer] Inc[employer] ° °

## 2017-12-03 ENCOUNTER — Ambulatory Visit: Payer: Medicare HMO | Admitting: Anesthesiology

## 2017-12-03 ENCOUNTER — Encounter
Admission: RE | Disposition: A | Discharge status: Home / Self Care | Payer: Self-pay | Source: Ambulatory Visit | Attending: Ophthalmology

## 2017-12-03 ENCOUNTER — Ambulatory Visit
Admission: RE | Admit: 2017-12-03 | Discharge: 2017-12-03 | Disposition: A | Discharge status: Home / Self Care | Payer: Medicare HMO | Source: Ambulatory Visit | Attending: Ophthalmology | Admitting: Ophthalmology

## 2017-12-03 DIAGNOSIS — Z6839 Body mass index (BMI) 39.0-39.9, adult: Secondary | ICD-10-CM | POA: Diagnosis not present

## 2017-12-03 DIAGNOSIS — G2581 Restless legs syndrome: Secondary | ICD-10-CM | POA: Diagnosis not present

## 2017-12-03 DIAGNOSIS — Z87891 Personal history of nicotine dependence: Secondary | ICD-10-CM | POA: Insufficient documentation

## 2017-12-03 DIAGNOSIS — H02831 Dermatochalasis of right upper eyelid: Secondary | ICD-10-CM | POA: Diagnosis not present

## 2017-12-03 DIAGNOSIS — M1990 Unspecified osteoarthritis, unspecified site: Secondary | ICD-10-CM | POA: Insufficient documentation

## 2017-12-03 DIAGNOSIS — Z882 Allergy status to sulfonamides status: Secondary | ICD-10-CM | POA: Insufficient documentation

## 2017-12-03 DIAGNOSIS — Z8612 Personal history of poliomyelitis: Secondary | ICD-10-CM | POA: Diagnosis not present

## 2017-12-03 DIAGNOSIS — G4730 Sleep apnea, unspecified: Secondary | ICD-10-CM | POA: Diagnosis not present

## 2017-12-03 DIAGNOSIS — K219 Gastro-esophageal reflux disease without esophagitis: Secondary | ICD-10-CM | POA: Diagnosis not present

## 2017-12-03 DIAGNOSIS — H02834 Dermatochalasis of left upper eyelid: Secondary | ICD-10-CM | POA: Diagnosis present

## 2017-12-03 DIAGNOSIS — Z85820 Personal history of malignant melanoma of skin: Secondary | ICD-10-CM | POA: Diagnosis not present

## 2017-12-03 HISTORY — DX: Motion sickness, initial encounter: T75.3XXA

## 2017-12-03 HISTORY — PX: BROW LIFT: SHX178

## 2017-12-03 SURGERY — BLEPHAROPLASTY
Anesthesia: Monitor Anesthesia Care | Site: Eye | Laterality: Bilateral | Wound class: Clean

## 2017-12-03 MED ORDER — ERYTHROMYCIN 5 MG/GM OP OINT: TOPICAL_OINTMENT | OPHTHALMIC | 3 refills | Status: DC

## 2017-12-03 MED ORDER — TETRACAINE HCL 0.5 % OP SOLN
OPHTHALMIC | Status: DC | PRN
  Administered 2017-12-03: 2 [drp] via OPHTHALMIC

## 2017-12-03 MED ORDER — BSS IO SOLN
INTRAOCULAR | Status: DC | PRN
  Administered 2017-12-03: 15 mL

## 2017-12-03 MED ORDER — OXYCODONE HCL 5 MG/5ML PO SOLN: 5.0000 mg | Freq: Once | ORAL | Status: DC | PRN

## 2017-12-03 MED ORDER — PROPOFOL 500 MG/50ML IV EMUL
INTRAVENOUS | Status: DC | PRN
  Administered 2017-12-03: 25 ug/kg/min via INTRAVENOUS

## 2017-12-03 MED ORDER — MIDAZOLAM HCL 2 MG/2ML IJ SOLN
INTRAMUSCULAR | Status: DC | PRN
  Administered 2017-12-03 (×2): 1 mg via INTRAVENOUS

## 2017-12-03 MED ORDER — OXYCODONE HCL 5 MG PO TABS: 5.0000 mg | ORAL_TABLET | Freq: Once | ORAL | Status: DC | PRN

## 2017-12-03 MED ORDER — ALFENTANIL 500 MCG/ML IJ INJ
INJECTION | INTRAVENOUS | Status: DC | PRN
  Administered 2017-12-03: 100 ug via INTRAVENOUS
  Administered 2017-12-03: 200 ug via INTRAVENOUS
  Administered 2017-12-03: 500 ug via INTRAVENOUS

## 2017-12-03 MED ORDER — ERYTHROMYCIN 5 MG/GM OP OINT
TOPICAL_OINTMENT | OPHTHALMIC | Status: DC | PRN
  Administered 2017-12-03: 1 via OPHTHALMIC

## 2017-12-03 MED ORDER — OXYCODONE-ACETAMINOPHEN 5-325 MG PO TABS: 1.0000 | ORAL_TABLET | ORAL | 0 refills | Status: DC | PRN

## 2017-12-03 MED ORDER — LACTATED RINGERS IV SOLN
INTRAVENOUS | Status: DC
  Administered 2017-12-03: 12:00:00 via INTRAVENOUS

## 2017-12-03 MED ORDER — LIDOCAINE-EPINEPHRINE 2 %-1:100000 IJ SOLN
INTRAMUSCULAR | Status: DC | PRN
  Administered 2017-12-03: 2 mL via OPHTHALMIC

## 2017-12-03 SURGICAL SUPPLY — 21 items
APPLICATOR COTTON TIP WD 3 STR (MISCELLANEOUS) ×3 IMPLANT
BLADE SURG 15 STRL LF DISP TIS (BLADE) ×1 IMPLANT
BLADE SURG 15 STRL SS (BLADE) ×2
CORD BIP STRL DISP 12FT (MISCELLANEOUS) ×3 IMPLANT
DRAPE HEAD BAR (DRAPES) ×3 IMPLANT
GAUZE SPONGE 4X4 12PLY STRL (GAUZE/BANDAGES/DRESSINGS) ×3 IMPLANT
GAUZE SPONGE NON-WVN 2X2 STRL (MISCELLANEOUS) ×10 IMPLANT
GLOVE SURG LX 7.0 MICRO (GLOVE) ×4
GLOVE SURG LX STRL 7.0 MICRO (GLOVE) ×2 IMPLANT
MARKER SKIN XFINE TIP W/RULER (MISCELLANEOUS) ×3 IMPLANT
NEEDLE FILTER BLUNT 18X 1/2SAF (NEEDLE) ×2
NEEDLE FILTER BLUNT 18X1 1/2 (NEEDLE) ×1 IMPLANT
NEEDLE HYPO 30X.5 LL (NEEDLE) ×6 IMPLANT
PACK DRAPE NASAL/ENT (PACKS) ×3 IMPLANT
SOL PREP PVP 2OZ (MISCELLANEOUS) ×3
SOLUTION PREP PVP 2OZ (MISCELLANEOUS) ×1 IMPLANT
SPONGE VERSALON 2X2 STRL (MISCELLANEOUS) ×20
SUT PLAIN GUT (SUTURE) ×3 IMPLANT
SYR 3ML LL SCALE MARK (SYRINGE) ×3 IMPLANT
SYRINGE 10CC LL (SYRINGE) ×3 IMPLANT
WATER STERILE IRR 250ML POUR (IV SOLUTION) ×3 IMPLANT

## 2017-12-03 NOTE — Interval H&P Note (Signed)
History and Physical Interval Note:  12/03/2017 12:02 PM  Sherry Vaughan  has presented today for surgery, with the diagnosis of H02.831  H02.834 DERMATOCHALASIS  The various methods of treatment have been discussed with the patient and family. After consideration of risks, benefits and other options for treatment, the patient has consented to  Procedure(s): BLEPHAROPLASTY (Bilateral) as a surgical intervention .  The patient's history has been reviewed, patient examined, no change in status, stable for surgery.  I have reviewed the patient's chart and labs.  Questions were answered to the patient's satisfaction.     Vickki Muff, Ragina Fenter M

## 2017-12-03 NOTE — Anesthesia Postprocedure Evaluation (Signed)
Anesthesia Post Note  Patient: Sherry Vaughan  Procedure(s) Performed: BLEPHAROPLASTY (Bilateral Eye)  Patient location during evaluation: PACU Anesthesia Type: MAC Level of consciousness: awake and alert Pain management: pain level controlled Vital Signs Assessment: post-procedure vital signs reviewed and stable Respiratory status: spontaneous breathing, nonlabored ventilation, respiratory function stable and patient connected to nasal cannula oxygen Cardiovascular status: stable and blood pressure returned to baseline Postop Assessment: no apparent nausea or vomiting Anesthetic complications: no    Melvyn Hommes

## 2017-12-03 NOTE — Transfer of Care (Signed)
Immediate Anesthesia Transfer of Care Note  Patient: Sherry Vaughan  Procedure(s) Performed: BLEPHAROPLASTY (Bilateral Eye)  Patient Location: PACU  Anesthesia Type: MAC  Level of Consciousness: awake, alert  and patient cooperative  Airway and Oxygen Therapy: Patient Spontanous Breathing and Patient connected to supplemental oxygen  Post-op Assessment: Post-op Vital signs reviewed, Patient's Cardiovascular Status Stable, Respiratory Function Stable, Patent Airway and No signs of Nausea or vomiting  Post-op Vital Signs: Reviewed and stable  Complications: No apparent anesthesia complications

## 2017-12-03 NOTE — H&P (Signed)
See the history and physical completed at [facility] on 11/19/17 and scanned into the chart.

## 2017-12-03 NOTE — Anesthesia Preprocedure Evaluation (Signed)
Anesthesia Evaluation  Patient identified by MRN, date of birth, ID band  Reviewed: NPO status   History of Anesthesia Complications Negative for: history of anesthetic complications  Airway Mallampati: II  TM Distance: >3 FB Neck ROM: full    Dental no notable dental hx.    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 negative psych ROS   GI/Hepatic Neg liver ROS, GERD  Controlled,  Endo/Other  diabetes (diet ctrl)Morbid obesity (bmi=39;)  Renal/GU negative Renal ROS  negative genitourinary   Musculoskeletal  (+) Arthritis ,   Abdominal   Peds  Hematology Melanoma    Anesthesia Other Findings   Reproductive/Obstetrics                             Anesthesia Physical Anesthesia Plan  ASA: II  Anesthesia Plan: MAC   Post-op Pain Management:    Induction:   PONV Risk Score and Plan:   Airway Management Planned:   Additional Equipment:   Intra-op Plan:   Post-operative Plan:   Informed Consent: I have reviewed the patients History and Physical, chart, labs and discussed the procedure including the risks, benefits and alternatives for the proposed anesthesia with the patient or authorized representative who has indicated his/her understanding and acceptance.     Plan Discussed with: CRNA  Anesthesia Plan Comments:         Anesthesia Quick Evaluation

## 2017-12-03 NOTE — Op Note (Signed)
Preoperative Diagnosis:  Visually significant dermatochalasis bilateral Upper Eyelid(s)  Postoperative Diagnosis:  Same.  Procedure(s) Performed:   Upper eyelid blepharoplasty with excess skin excision  bilateral Upper Eyelid(s)  Teaching Surgeon: Philis Pique. Vickki Muff, M.D.  Assistants: none  Anesthesia: MAC  Specimens: None.  Estimated Blood Loss: Minimal.  Complications: None.  Operative Findings: None Dictated  Procedure:   Allergies were reviewed and the patient is allergic to Sulfa antibiotics.   After the risks, benefits, complications and alternatives were discussed with the patient, appropriate informed consent was obtained and the patient was brought to the operating suite. The patient was reclined supine and a timeout was conducted.  The patient was then sedated.  Local anesthetic consisting of a 50-50 mixture of 2% lidocaine with epinephrine and 0.75% bupivacaine with added Hylenex was injected subcutaneously to both upper eyelid(s). After adequate local was instilled, the patient was prepped and draped in the usual sterile fashion for eyelid surgery.   Attention was turned to the upper eyelids. A 57m upper eyelid crease incision line was marked with calipers on both upper eyelid(s).  A pinch test was used to estimate the amount of excess skin to remove and this was marked in standard blepharoplasty style fashion. Attention was turned to the  right upper eyelid. A #15 blade was used to open the premarked incision line. A skin and muscle flap was excised and hemostasis was obtained with bipolar cautery.   A buttonhole was created medially in orbicularis and orbital septum to reveal the medial fat pocket. This was dissected free from fascial attachments, cauterized towards the pedicle base and excised to produce a nice flattening of the medial corner of the upper eyelid.  Attention was then turned to the opposite eyelid where the same procedure was performed in the same manner.  Hemostasis was obtained with bipolar cautery throughout. All incisions were then closed with a combination of running and interrupted 6-0 fast absorbing plain suture. The patient tolerated the procedure well.  Erythromycin Ophthalmic ointment was applied to her incision sites, followed by ice packs. She was taken to the recovery area where she recovered without difficulty.  Post-Op Plan/Instructions:  The patient was instructed to use ice packs frequently for the next 48 hours. She was instructed to use erythromycin ophthalmic ointment on her incisions 4 times a day for the next 12 to 14 days. She was given a prescription for Percocet for pain control should Tylenol not be effective. [facility] [place] needed [place] problems.  Lida Berkery M. FVickki Muff M.D. Attending,Ophthalmology

## 2017-12-03 NOTE — Anesthesia Procedure Notes (Signed)
Procedure Name: MAC Date/Time: 12/03/2017 12:22 PM Performed by: Cameron Ali, CRNA Pre-anesthesia Checklist: Patient identified, Emergency Drugs available, Suction available, Timeout performed and Patient being monitored Patient Re-evaluated:Patient Re-evaluated prior to induction Oxygen Delivery Method: Nasal cannula Placement Confirmation: positive ETCO2

## 2017-12-04 ENCOUNTER — Encounter: Payer: Self-pay | Admitting: Ophthalmology

## 2018-09-09 ENCOUNTER — Other Ambulatory Visit: Payer: Self-pay | Admitting: Family Medicine

## 2018-09-09 DIAGNOSIS — Z1231 Encounter for screening mammogram for malignant neoplasm of breast: Secondary | ICD-10-CM

## 2018-09-23 ENCOUNTER — Ambulatory Visit
Admission: RE | Admit: 2018-09-23 | Discharge: 2018-09-23 | Disposition: A | Discharge status: Home / Self Care | Payer: Medicare HMO | Source: Ambulatory Visit | Attending: Family Medicine | Admitting: Family Medicine

## 2018-09-23 DIAGNOSIS — Z1231 Encounter for screening mammogram for malignant neoplasm of breast: Secondary | ICD-10-CM | POA: Diagnosis present

## 2019-03-02 ENCOUNTER — Other Ambulatory Visit: Payer: Self-pay

## 2019-03-02 ENCOUNTER — Encounter: Payer: Self-pay | Admitting: *Deleted

## 2019-03-03 ENCOUNTER — Encounter: Payer: Self-pay | Admitting: Anesthesiology

## 2019-03-09 ENCOUNTER — Ambulatory Visit: Admit: 2019-03-09 | Payer: Medicare HMO | Admitting: Ophthalmology

## 2019-03-09 SURGERY — PHACOEMULSIFICATION, CATARACT, WITH IOL INSERTION
Anesthesia: Topical | Laterality: Right

## 2019-04-29 ENCOUNTER — Encounter: Payer: Self-pay | Admitting: *Deleted

## 2019-04-29 ENCOUNTER — Other Ambulatory Visit: Payer: Self-pay

## 2019-04-30 ENCOUNTER — Other Ambulatory Visit
Admission: RE | Admit: 2019-04-30 | Discharge: 2019-04-30 | Disposition: A | Discharge status: Home / Self Care | Payer: Medicare HMO | Source: Ambulatory Visit | Attending: Ophthalmology | Admitting: Ophthalmology

## 2019-04-30 DIAGNOSIS — Z1159 Encounter for screening for other viral diseases: Secondary | ICD-10-CM | POA: Insufficient documentation

## 2019-05-01 LAB — NOVEL CORONAVIRUS, NAA (HOSP ORDER, SEND-OUT TO REF LAB; TAT 18-24 HRS): SARS-CoV-2, NAA: NOT DETECTED

## 2019-05-01 NOTE — Discharge Instructions (Signed)

## 2019-05-04 ENCOUNTER — Ambulatory Visit: Payer: Medicare HMO | Admitting: Anesthesiology

## 2019-05-04 ENCOUNTER — Encounter
Admission: RE | Disposition: A | Discharge status: Home / Self Care | Payer: Self-pay | Source: Home / Self Care | Attending: Ophthalmology

## 2019-05-04 ENCOUNTER — Ambulatory Visit
Admission: RE | Admit: 2019-05-04 | Discharge: 2019-05-04 | Disposition: A | Discharge status: Home / Self Care | Payer: Medicare HMO | Attending: Ophthalmology | Admitting: Ophthalmology

## 2019-05-04 DIAGNOSIS — E669 Obesity, unspecified: Secondary | ICD-10-CM | POA: Diagnosis not present

## 2019-05-04 DIAGNOSIS — H2511 Age-related nuclear cataract, right eye: Secondary | ICD-10-CM | POA: Insufficient documentation

## 2019-05-04 DIAGNOSIS — Z6838 Body mass index (BMI) 38.0-38.9, adult: Secondary | ICD-10-CM | POA: Diagnosis not present

## 2019-05-04 DIAGNOSIS — Z87891 Personal history of nicotine dependence: Secondary | ICD-10-CM | POA: Diagnosis not present

## 2019-05-04 DIAGNOSIS — E7800 Pure hypercholesterolemia, unspecified: Secondary | ICD-10-CM | POA: Insufficient documentation

## 2019-05-04 DIAGNOSIS — E1136 Type 2 diabetes mellitus with diabetic cataract: Secondary | ICD-10-CM | POA: Insufficient documentation

## 2019-05-04 DIAGNOSIS — Z79899 Other long term (current) drug therapy: Secondary | ICD-10-CM | POA: Insufficient documentation

## 2019-05-04 DIAGNOSIS — Z85828 Personal history of other malignant neoplasm of skin: Secondary | ICD-10-CM | POA: Diagnosis not present

## 2019-05-04 DIAGNOSIS — K219 Gastro-esophageal reflux disease without esophagitis: Secondary | ICD-10-CM | POA: Diagnosis not present

## 2019-05-04 HISTORY — PX: CATARACT EXTRACTION W/PHACO: SHX586

## 2019-05-04 SURGERY — PHACOEMULSIFICATION, CATARACT, WITH IOL INSERTION
Anesthesia: Monitor Anesthesia Care | Site: Eye | Laterality: Right

## 2019-05-04 MED ORDER — MIDAZOLAM HCL 2 MG/2ML IJ SOLN
INTRAMUSCULAR | Status: DC | PRN
  Administered 2019-05-04: 1 mg via INTRAVENOUS

## 2019-05-04 MED ORDER — SODIUM HYALURONATE 23 MG/ML IO SOLN
INTRAOCULAR | Status: DC | PRN
  Administered 2019-05-04: 0.6 mL via INTRAOCULAR

## 2019-05-04 MED ORDER — MOXIFLOXACIN HCL 0.5 % OP SOLN
OPHTHALMIC | Status: DC | PRN
  Administered 2019-05-04: 0.2 mL via OPHTHALMIC

## 2019-05-04 MED ORDER — SODIUM HYALURONATE 10 MG/ML IO SOLN
INTRAOCULAR | Status: DC | PRN
  Administered 2019-05-04: 0.55 mL via INTRAOCULAR

## 2019-05-04 MED ORDER — TETRACAINE HCL 0.5 % OP SOLN
1.0000 [drp] | OPHTHALMIC | Status: DC | PRN
  Administered 2019-05-04 (×3): 1 [drp] via OPHTHALMIC

## 2019-05-04 MED ORDER — ARMC OPHTHALMIC DILATING DROPS
1.0000 "application " | OPHTHALMIC | Status: DC | PRN
  Administered 2019-05-04 (×3): 1 via OPHTHALMIC

## 2019-05-04 MED ORDER — LIDOCAINE HCL (PF) 2 % IJ SOLN
INTRAOCULAR | Status: DC | PRN
  Administered 2019-05-04: 2 mL via INTRAOCULAR

## 2019-05-04 MED ORDER — EPINEPHRINE PF 1 MG/ML IJ SOLN
INTRAOCULAR | Status: DC | PRN
  Administered 2019-05-04: 09:00:00 68 mL via OPHTHALMIC

## 2019-05-04 MED ORDER — FENTANYL CITRATE (PF) 100 MCG/2ML IJ SOLN
INTRAMUSCULAR | Status: DC | PRN
  Administered 2019-05-04: 50 ug via INTRAVENOUS

## 2019-05-04 MED ORDER — LACTATED RINGERS IV SOLN: 10.0000 mL/h | INTRAVENOUS | Status: DC

## 2019-05-04 SURGICAL SUPPLY — 19 items
CANNULA ANT/CHMB 27G (MISCELLANEOUS) ×2 IMPLANT
CANNULA ANT/CHMB 27GA (MISCELLANEOUS) ×6 IMPLANT
DISSECTOR HYDRO NUCLEUS 50X22 (MISCELLANEOUS) ×3 IMPLANT
GLOVE SURG LX 7.5 STRW (GLOVE) ×2
GLOVE SURG LX STRL 7.5 STRW (GLOVE) ×1 IMPLANT
GLOVE SURG SYN 8.5  E (GLOVE) ×2
GLOVE SURG SYN 8.5 E (GLOVE) ×1 IMPLANT
GLOVE SURG SYN 8.5 PF PI (GLOVE) ×1 IMPLANT
GOWN STRL REUS W/ TWL LRG LVL3 (GOWN DISPOSABLE) ×2 IMPLANT
GOWN STRL REUS W/TWL LRG LVL3 (GOWN DISPOSABLE) ×4
LENS IOL TECNIS ITEC 15.0 (Intraocular Lens) ×2 IMPLANT
MARKER SKIN DUAL TIP RULER LAB (MISCELLANEOUS) ×3 IMPLANT
PACK DR. KING ARMS (PACKS) ×3 IMPLANT
PACK EYE AFTER SURG (MISCELLANEOUS) ×3 IMPLANT
PACK OPTHALMIC (MISCELLANEOUS) ×3 IMPLANT
SYR 3ML LL SCALE MARK (SYRINGE) ×3 IMPLANT
SYR TB 1ML LUER SLIP (SYRINGE) ×3 IMPLANT
WATER STERILE IRR 500ML POUR (IV SOLUTION) ×3 IMPLANT
WIPE NON LINTING 3.25X3.25 (MISCELLANEOUS) ×3 IMPLANT

## 2019-05-04 NOTE — Anesthesia Preprocedure Evaluation (Signed)
Anesthesia Evaluation  Patient identified by MRN, date of birth, ID band Patient awake    Reviewed: Allergy & Precautions, NPO status , Patient's Chart, lab work & pertinent test results  Airway Mallampati: II  TM Distance: >3 FB     Dental   Pulmonary former smoker,    breath sounds clear to auscultation       Cardiovascular  Rhythm:Regular Rate:Normal     Neuro/Psych Restless leg    GI/Hepatic GERD  ,  Endo/Other  diabetesObesity - BMI 38  Renal/GU      Musculoskeletal  (+) Arthritis ,   Abdominal   Peds  Hematology   Anesthesia Other Findings   Reproductive/Obstetrics                             Anesthesia Physical Anesthesia Plan  ASA: II  Anesthesia Plan: MAC   Post-op Pain Management:    Induction: Intravenous  PONV Risk Score and Plan:   Airway Management Planned: Nasal Cannula  Additional Equipment:   Intra-op Plan:   Post-operative Plan:   Informed Consent: I have reviewed the patients History and Physical, chart, labs and discussed the procedure including the risks, benefits and alternatives for the proposed anesthesia with the patient or authorized representative who has indicated his/her understanding and acceptance.       Plan Discussed with: CRNA  Anesthesia Plan Comments:         Anesthesia Quick Evaluation

## 2019-05-04 NOTE — Op Note (Signed)
OPERATIVE NOTE  Sherry Vaughan [id number] 05/04/2019   PREOPERATIVE DIAGNOSIS:  Nuclear sclerotic cataract right eye.  H25.11   POSTOPERATIVE DIAGNOSIS:    Nuclear sclerotic cataract right eye.     PROCEDURE:  Phacoemusification with posterior chamber intraocular lens placement of the right eye   LENS:   Implant Name Type Inv. Item Serial No. Manufacturer Lot No. LRB No. Used  LENS IOL DIOP 15.0 - [id number] Intraocular Lens LENS IOL DIOP 15.0 [id number] AMO  Right 1       PCB00 +15.0   ULTRASOUND TIME: 0 minutes 59 seconds.  CDE 9.94   SURGEON:  Benay Pillow, MD, MPH  ANESTHESIOLOGIST: Anesthesiologist: Veda Canning, MD CRNA: Cameron Ali, CRNA   ANESTHESIA:  Topical with tetracaine drops augmented with 1% preservative-free intracameral lidocaine.  ESTIMATED BLOOD LOSS: less than 1 mL.   COMPLICATIONS:  None.   DESCRIPTION OF PROCEDURE:  The patient was identified in the holding room and transported to the operating room and placed in the supine position under the operating microscope.  The right eye was identified as the operative eye and it was prepped and draped in the usual sterile ophthalmic fashion.   A 1.0 millimeter clear-corneal paracentesis was made at the 10:30 position. 0.5 ml of preservative-free 1% lidocaine with epinephrine was injected into the anterior chamber.  The anterior chamber was filled with Healon 5 viscoelastic.  A 2.4 millimeter keratome was used to make a near-clear corneal incision at the 8:00 position.  A curvilinear capsulorrhexis was made with a cystotome and capsulorrhexis forceps.  Balanced salt solution was used to hydrodissect and hydrodelineate the nucleus.   Phacoemulsification was then used in stop and chop fashion to remove the lens nucleus and epinucleus.  The remaining cortex was then removed using the irrigation and aspiration handpiece. Healon was then placed into the capsular bag to distend it for lens placement.  A lens was then  injected into the capsular bag.  The remaining viscoelastic was aspirated.   Wounds were hydrated with balanced salt solution.  The anterior chamber was inflated to a physiologic pressure with balanced salt solution.   Intracameral vigamox 0.1 mL undiluted was injected into the eye and a drop placed onto the ocular surface.  No wound leaks were noted.  The patient was taken to the recovery room in stable condition without complications of anesthesia or surgery  Benay Pillow 05/04/2019, 9:04 AM

## 2019-05-04 NOTE — Anesthesia Postprocedure Evaluation (Signed)
Anesthesia Post Note  Patient: Sherry Vaughan  Procedure(s) Performed: CATARACT EXTRACTION PHACO AND INTRAOCULAR LENS PLACEMENT (IOC) RIGHT DIABETES DIET CONTROL ONLY (Right Eye)  Patient location during evaluation: PACU Anesthesia Type: MAC Level of consciousness: awake and alert Pain management: pain level controlled Vital Signs Assessment: post-procedure vital signs reviewed and stable Respiratory status: spontaneous breathing, nonlabored ventilation, respiratory function stable and patient connected to nasal cannula oxygen Cardiovascular status: stable and blood pressure returned to baseline Postop Assessment: no apparent nausea or vomiting Anesthetic complications: no    Veda Canning

## 2019-05-04 NOTE — Anesthesia Procedure Notes (Signed)
Procedure Name: MAC Date/Time: 05/04/2019 8:45 AM Performed by: Cameron Ali, CRNA Pre-anesthesia Checklist: Patient identified, Emergency Drugs available, Suction available, Timeout performed and Patient being monitored Patient Re-evaluated:Patient Re-evaluated prior to induction Oxygen Delivery Method: Nasal cannula Placement Confirmation: positive ETCO2

## 2019-05-04 NOTE — H&P (Signed)

## 2019-05-04 NOTE — Transfer of Care (Signed)
Immediate Anesthesia Transfer of Care Note  Patient: Sherry Vaughan  Procedure(s) Performed: CATARACT EXTRACTION PHACO AND INTRAOCULAR LENS PLACEMENT (IOC) RIGHT DIABETES DIET CONTROL ONLY (Right Eye)  Patient Location: PACU  Anesthesia Type: MAC  Level of Consciousness: awake, alert  and patient cooperative  Airway and Oxygen Therapy: Patient Spontanous Breathing and Patient connected to supplemental oxygen  Post-op Assessment: Post-op Vital signs reviewed, Patient's Cardiovascular Status Stable, Respiratory Function Stable, Patent Airway and No signs of Nausea or vomiting  Post-op Vital Signs: Reviewed and stable  Complications: No apparent anesthesia complications

## 2019-05-05 ENCOUNTER — Encounter: Payer: Self-pay | Admitting: Ophthalmology

## 2019-05-25 ENCOUNTER — Other Ambulatory Visit: Payer: Self-pay

## 2019-05-25 ENCOUNTER — Encounter: Payer: Self-pay | Admitting: *Deleted

## 2019-05-28 ENCOUNTER — Encounter
Admission: RE | Admit: 2019-05-28 | Discharge: 2019-05-28 | Disposition: A | Discharge status: Home / Self Care | Payer: Medicare HMO | Source: Ambulatory Visit | Attending: Ophthalmology | Admitting: Ophthalmology

## 2019-05-28 ENCOUNTER — Other Ambulatory Visit: Payer: Self-pay

## 2019-05-28 DIAGNOSIS — Z1159 Encounter for screening for other viral diseases: Secondary | ICD-10-CM | POA: Insufficient documentation

## 2019-05-28 NOTE — Discharge Instructions (Signed)

## 2019-05-29 LAB — NOVEL CORONAVIRUS, NAA (HOSP ORDER, SEND-OUT TO REF LAB; TAT 18-24 HRS): SARS-CoV-2, NAA: NOT DETECTED

## 2019-06-01 ENCOUNTER — Ambulatory Visit: Payer: Medicare HMO | Admitting: Anesthesiology

## 2019-06-01 ENCOUNTER — Ambulatory Visit
Admission: RE | Admit: 2019-06-01 | Discharge: 2019-06-01 | Disposition: A | Discharge status: Home / Self Care | Payer: Medicare HMO | Attending: Ophthalmology | Admitting: Ophthalmology

## 2019-06-01 ENCOUNTER — Encounter
Admission: RE | Disposition: A | Discharge status: Home / Self Care | Payer: Self-pay | Source: Home / Self Care | Attending: Ophthalmology

## 2019-06-01 DIAGNOSIS — Z79899 Other long term (current) drug therapy: Secondary | ICD-10-CM | POA: Insufficient documentation

## 2019-06-01 DIAGNOSIS — H2512 Age-related nuclear cataract, left eye: Secondary | ICD-10-CM | POA: Insufficient documentation

## 2019-06-01 DIAGNOSIS — G2581 Restless legs syndrome: Secondary | ICD-10-CM | POA: Insufficient documentation

## 2019-06-01 DIAGNOSIS — E7800 Pure hypercholesterolemia, unspecified: Secondary | ICD-10-CM | POA: Insufficient documentation

## 2019-06-01 DIAGNOSIS — Z9849 Cataract extraction status, unspecified eye: Secondary | ICD-10-CM | POA: Diagnosis not present

## 2019-06-01 DIAGNOSIS — E669 Obesity, unspecified: Secondary | ICD-10-CM | POA: Insufficient documentation

## 2019-06-01 DIAGNOSIS — Z87891 Personal history of nicotine dependence: Secondary | ICD-10-CM | POA: Diagnosis not present

## 2019-06-01 DIAGNOSIS — Z6837 Body mass index (BMI) 37.0-37.9, adult: Secondary | ICD-10-CM | POA: Insufficient documentation

## 2019-06-01 DIAGNOSIS — Z85828 Personal history of other malignant neoplasm of skin: Secondary | ICD-10-CM | POA: Diagnosis not present

## 2019-06-01 DIAGNOSIS — E1136 Type 2 diabetes mellitus with diabetic cataract: Secondary | ICD-10-CM | POA: Insufficient documentation

## 2019-06-01 DIAGNOSIS — Z7982 Long term (current) use of aspirin: Secondary | ICD-10-CM | POA: Insufficient documentation

## 2019-06-01 DIAGNOSIS — Z882 Allergy status to sulfonamides status: Secondary | ICD-10-CM | POA: Insufficient documentation

## 2019-06-01 DIAGNOSIS — K219 Gastro-esophageal reflux disease without esophagitis: Secondary | ICD-10-CM | POA: Diagnosis not present

## 2019-06-01 HISTORY — PX: CATARACT EXTRACTION W/PHACO: SHX586

## 2019-06-01 SURGERY — PHACOEMULSIFICATION, CATARACT, WITH IOL INSERTION
Anesthesia: Monitor Anesthesia Care | Site: Eye | Laterality: Left

## 2019-06-01 MED ORDER — ONDANSETRON HCL 4 MG/2ML IJ SOLN: 4.0000 mg | Freq: Once | INTRAMUSCULAR | Status: DC | PRN

## 2019-06-01 MED ORDER — ARMC OPHTHALMIC DILATING DROPS
1.0000 "application " | OPHTHALMIC | Status: DC | PRN
  Administered 2019-06-01 (×3): 1 via OPHTHALMIC

## 2019-06-01 MED ORDER — EPINEPHRINE PF 1 MG/ML IJ SOLN
INTRAOCULAR | Status: DC | PRN
  Administered 2019-06-01: 84 mL via OPHTHALMIC

## 2019-06-01 MED ORDER — SODIUM HYALURONATE 23 MG/ML IO SOLN
INTRAOCULAR | Status: DC | PRN
  Administered 2019-06-01: 0.6 mL via INTRAOCULAR

## 2019-06-01 MED ORDER — TETRACAINE HCL 0.5 % OP SOLN
1.0000 [drp] | OPHTHALMIC | Status: DC | PRN
  Administered 2019-06-01 (×3): 1 [drp] via OPHTHALMIC

## 2019-06-01 MED ORDER — MOXIFLOXACIN HCL 0.5 % OP SOLN
OPHTHALMIC | Status: DC | PRN
  Administered 2019-06-01: 0.2 mL via OPHTHALMIC

## 2019-06-01 MED ORDER — FENTANYL CITRATE (PF) 100 MCG/2ML IJ SOLN
INTRAMUSCULAR | Status: DC | PRN
  Administered 2019-06-01: 50 ug via INTRAVENOUS

## 2019-06-01 MED ORDER — SODIUM HYALURONATE 10 MG/ML IO SOLN
INTRAOCULAR | Status: DC | PRN
  Administered 2019-06-01: 0.55 mL via INTRAOCULAR

## 2019-06-01 MED ORDER — MIDAZOLAM HCL 2 MG/2ML IJ SOLN
INTRAMUSCULAR | Status: DC | PRN
  Administered 2019-06-01: 2 mg via INTRAVENOUS

## 2019-06-01 MED ORDER — LIDOCAINE HCL (PF) 2 % IJ SOLN
INTRAOCULAR | Status: DC | PRN
  Administered 2019-06-01: 2 mL via INTRAOCULAR

## 2019-06-01 SURGICAL SUPPLY — 19 items
CANNULA ANT/CHMB 27G (MISCELLANEOUS) ×2 IMPLANT
CANNULA ANT/CHMB 27GA (MISCELLANEOUS) ×6 IMPLANT
DISSECTOR HYDRO NUCLEUS 50X22 (MISCELLANEOUS) ×3 IMPLANT
GLOVE SURG LX 7.5 STRW (GLOVE) ×4
GLOVE SURG LX STRL 7.5 STRW (GLOVE) ×1 IMPLANT
GLOVE SURG SYN 8.5  E (GLOVE) ×2
GLOVE SURG SYN 8.5 E (GLOVE) ×1 IMPLANT
GLOVE SURG SYN 8.5 PF PI (GLOVE) ×1 IMPLANT
GOWN STRL REUS W/ TWL LRG LVL3 (GOWN DISPOSABLE) ×2 IMPLANT
GOWN STRL REUS W/TWL LRG LVL3 (GOWN DISPOSABLE) ×4
LENS IOL TECNIS ITEC 11.5 (Intraocular Lens) ×2 IMPLANT
MARKER SKIN DUAL TIP RULER LAB (MISCELLANEOUS) ×3 IMPLANT
PACK DR. KING ARMS (PACKS) ×3 IMPLANT
PACK EYE AFTER SURG (MISCELLANEOUS) ×3 IMPLANT
PACK OPTHALMIC (MISCELLANEOUS) ×3 IMPLANT
SYR 3ML LL SCALE MARK (SYRINGE) ×3 IMPLANT
SYR TB 1ML LUER SLIP (SYRINGE) ×3 IMPLANT
WATER STERILE IRR 250ML POUR (IV SOLUTION) ×3 IMPLANT
WIPE NON LINTING 3.25X3.25 (MISCELLANEOUS) ×3 IMPLANT

## 2019-06-01 NOTE — Anesthesia Procedure Notes (Signed)
Procedure Name: MAC Date/Time: 06/01/2019 12:43 PM Performed by: Cameron Ali, CRNA Pre-anesthesia Checklist: Patient identified, Emergency Drugs available, Suction available, Timeout performed and Patient being monitored Patient Re-evaluated:Patient Re-evaluated prior to induction Oxygen Delivery Method: Nasal cannula Placement Confirmation: positive ETCO2

## 2019-06-01 NOTE — Op Note (Signed)
OPERATIVE NOTE  Sherry Vaughan [id number] 06/01/2019   PREOPERATIVE DIAGNOSIS:  Nuclear sclerotic cataract left eye.  H25.12   POSTOPERATIVE DIAGNOSIS:    Nuclear sclerotic cataract left eye.     PROCEDURE:  Phacoemusification with posterior chamber intraocular lens placement of the left eye   LENS:   Implant Name Type Inv. Item Serial No. Manufacturer Lot No. LRB No. Used Action  LENS IOL DIOP 11.5 - [id number] Intraocular Lens LENS IOL DIOP 11.5 [id number] AMO  Left 1 Implanted       PCB00 +11.5   ULTRASOUND TIME: 1 minutes 11 seconds.  CDE 10.11   SURGEON:  Benay Pillow, MD, MPH   ANESTHESIA:  Topical with tetracaine drops augmented with 1% preservative-free intracameral lidocaine.  ESTIMATED BLOOD LOSS: <1 mL   COMPLICATIONS:  None.   DESCRIPTION OF PROCEDURE:  The patient was identified in the holding room and transported to the operating room and placed in the supine position under the operating microscope.  The left eye was identified as the operative eye and it was prepped and draped in the usual sterile ophthalmic fashion.   A 1.0 millimeter clear-corneal paracentesis was made at the 5:00 position. 0.5 ml of preservative-free 1% lidocaine with epinephrine was injected into the anterior chamber.  The anterior chamber was filled with Healon 5 viscoelastic.  A 2.4 millimeter keratome was used to make a near-clear corneal incision at the 2:00 position.  A curvilinear capsulorrhexis was made with a cystotome and capsulorrhexis forceps.  Balanced salt solution was used to hydrodissect and hydrodelineate the nucleus.   Phacoemulsification was then used in stop and chop fashion to remove the lens nucleus and epinucleus.  The remaining cortex was then removed using the irrigation and aspiration handpiece. Healon was then placed into the capsular bag to distend it for lens placement.  A lens was then injected into the capsular bag.  The remaining viscoelastic was aspirated.   Wounds  were hydrated with balanced salt solution.  The anterior chamber was inflated to a physiologic pressure with balanced salt solution.  Intracameral vigamox 0.1 mL undiltued was injected into the eye and a drop placed onto the ocular surface.  No wound leaks were noted.  The patient was taken to the recovery room in stable condition without complications of anesthesia or surgery  Benay Pillow 06/01/2019, 1:04 PM

## 2019-06-01 NOTE — Anesthesia Preprocedure Evaluation (Signed)
Anesthesia Evaluation  Patient identified by MRN, date of birth, ID band Patient awake    Reviewed: Allergy & Precautions, NPO status , Patient's Chart, lab work & pertinent test results  Airway Mallampati: II  TM Distance: >3 FB     Dental   Pulmonary former smoker,    breath sounds clear to auscultation       Cardiovascular  Rhythm:Regular Rate:Normal     Neuro/Psych Restless leg    GI/Hepatic GERD  ,  Endo/Other  diabetesObesity - BMI 38  Renal/GU      Musculoskeletal  (+) Arthritis ,   Abdominal   Peds  Hematology   Anesthesia Other Findings   Reproductive/Obstetrics                             Anesthesia Physical Anesthesia Plan  ASA: II  Anesthesia Plan: MAC   Post-op Pain Management:    Induction: Intravenous  PONV Risk Score and Plan:   Airway Management Planned: Nasal Cannula  Additional Equipment:   Intra-op Plan:   Post-operative Plan:   Informed Consent: I have reviewed the patients History and Physical, chart, labs and discussed the procedure including the risks, benefits and alternatives for the proposed anesthesia with the patient or authorized representative who has indicated his/her understanding and acceptance.       Plan Discussed with: CRNA  Anesthesia Plan Comments:         Anesthesia Quick Evaluation  

## 2019-06-01 NOTE — Anesthesia Postprocedure Evaluation (Signed)
Anesthesia Post Note  Patient: Sherry Vaughan  Procedure(s) Performed: CATARACT EXTRACTION PHACO AND INTRAOCULAR LENS PLACEMENT (IOC)  LEFT (Left Eye)  Patient location during evaluation: PACU Anesthesia Type: MAC Level of consciousness: awake and alert Pain management: pain level controlled Vital Signs Assessment: post-procedure vital signs reviewed and stable Respiratory status: spontaneous breathing, nonlabored ventilation, respiratory function stable and patient connected to nasal cannula oxygen Cardiovascular status: stable and blood pressure returned to baseline Postop Assessment: no apparent nausea or vomiting Anesthetic complications: no    Veda Canning

## 2019-06-01 NOTE — H&P (Signed)

## 2019-06-01 NOTE — Transfer of Care (Signed)
Immediate Anesthesia Transfer of Care Note  Patient: Sherry Vaughan  Procedure(s) Performed: CATARACT EXTRACTION PHACO AND INTRAOCULAR LENS PLACEMENT (IOC)  LEFT (Left Eye)  Patient Location: PACU  Anesthesia Type: MAC  Level of Consciousness: awake, alert  and patient cooperative  Airway and Oxygen Therapy: Patient Spontanous Breathing and Patient connected to supplemental oxygen  Post-op Assessment: Post-op Vital signs reviewed, Patient's Cardiovascular Status Stable, Respiratory Function Stable, Patent Airway and No signs of Nausea or vomiting  Post-op Vital Signs: Reviewed and stable  Complications: No apparent anesthesia complications

## 2019-06-02 ENCOUNTER — Encounter: Payer: Self-pay | Admitting: Ophthalmology

## 2019-09-30 ENCOUNTER — Other Ambulatory Visit: Payer: Self-pay | Admitting: Family Medicine

## 2019-09-30 DIAGNOSIS — Z1231 Encounter for screening mammogram for malignant neoplasm of breast: Secondary | ICD-10-CM

## 2019-11-03 ENCOUNTER — Ambulatory Visit
Admission: RE | Admit: 2019-11-03 | Discharge: 2019-11-03 | Disposition: A | Discharge status: Home / Self Care | Payer: Medicare HMO | Source: Ambulatory Visit | Attending: Family Medicine | Admitting: Family Medicine

## 2019-11-03 ENCOUNTER — Other Ambulatory Visit: Payer: Self-pay

## 2019-11-03 ENCOUNTER — Encounter (INDEPENDENT_AMBULATORY_CARE_PROVIDER_SITE_OTHER): Payer: Self-pay

## 2019-11-03 DIAGNOSIS — Z1231 Encounter for screening mammogram for malignant neoplasm of breast: Secondary | ICD-10-CM | POA: Insufficient documentation

## 2019-11-10 ENCOUNTER — Other Ambulatory Visit: Payer: Self-pay | Admitting: Gerontology

## 2019-11-10 DIAGNOSIS — N6320 Unspecified lump in the left breast, unspecified quadrant: Secondary | ICD-10-CM

## 2019-11-20 ENCOUNTER — Ambulatory Visit
Admission: RE | Admit: 2019-11-20 | Discharge: 2019-11-20 | Disposition: A | Discharge status: Home / Self Care | Payer: Medicare HMO | Source: Ambulatory Visit | Attending: Gerontology | Admitting: Gerontology

## 2019-11-20 DIAGNOSIS — N6320 Unspecified lump in the left breast, unspecified quadrant: Secondary | ICD-10-CM

## 2019-11-20 DIAGNOSIS — D242 Benign neoplasm of left breast: Secondary | ICD-10-CM | POA: Insufficient documentation

## 2020-07-13 ENCOUNTER — Other Ambulatory Visit: Payer: Self-pay | Admitting: Orthopedic Surgery

## 2020-07-13 DIAGNOSIS — M2391 Unspecified internal derangement of right knee: Secondary | ICD-10-CM

## 2020-07-13 DIAGNOSIS — M1711 Unilateral primary osteoarthritis, right knee: Secondary | ICD-10-CM

## 2020-07-13 DIAGNOSIS — M2351 Chronic instability of knee, right knee: Secondary | ICD-10-CM

## 2020-07-13 DIAGNOSIS — M25561 Pain in right knee: Secondary | ICD-10-CM

## 2020-07-27 ENCOUNTER — Ambulatory Visit
Admission: RE | Admit: 2020-07-27 | Discharge: 2020-07-27 | Disposition: A | Discharge status: Home / Self Care | Payer: Medicare HMO | Source: Ambulatory Visit | Attending: Orthopedic Surgery | Admitting: Orthopedic Surgery

## 2020-07-27 ENCOUNTER — Other Ambulatory Visit: Payer: Self-pay

## 2020-07-27 DIAGNOSIS — M2391 Unspecified internal derangement of right knee: Secondary | ICD-10-CM | POA: Insufficient documentation

## 2020-07-27 DIAGNOSIS — M2351 Chronic instability of knee, right knee: Secondary | ICD-10-CM | POA: Insufficient documentation

## 2020-07-27 DIAGNOSIS — M25561 Pain in right knee: Secondary | ICD-10-CM | POA: Insufficient documentation

## 2020-07-27 DIAGNOSIS — M1711 Unilateral primary osteoarthritis, right knee: Secondary | ICD-10-CM | POA: Diagnosis present

## 2020-09-21 ENCOUNTER — Other Ambulatory Visit: Payer: Self-pay | Admitting: Surgery

## 2020-10-03 ENCOUNTER — Other Ambulatory Visit: Payer: Self-pay

## 2020-10-03 ENCOUNTER — Encounter
Admission: RE | Admit: 2020-10-03 | Discharge: 2020-10-03 | Disposition: A | Discharge status: Home / Self Care | Payer: Medicare HMO | Source: Ambulatory Visit | Attending: Surgery | Admitting: Surgery

## 2020-10-03 DIAGNOSIS — Z01818 Encounter for other preprocedural examination: Secondary | ICD-10-CM | POA: Diagnosis present

## 2020-10-03 HISTORY — DX: Sleep apnea, unspecified: G47.30

## 2020-10-03 LAB — CBC WITH DIFFERENTIAL/PLATELET
Abs Immature Granulocytes: 0.02 10*3/uL (ref 0.00–0.07)
Basophils Absolute: 0 10*3/uL (ref 0.0–0.1)
Basophils Relative: 0 %
Eosinophils Absolute: 0.1 10*3/uL (ref 0.0–0.5)
Eosinophils Relative: 2 %
HCT: 46.4 % — ABNORMAL HIGH (ref 36.0–46.0)
Hemoglobin: 15.5 g/dL — ABNORMAL HIGH (ref 12.0–15.0)
Immature Granulocytes: 0 %
Lymphocytes Relative: 17 %
Lymphs Abs: 1 10*3/uL (ref 0.7–4.0)
MCH: 32.1 pg (ref 26.0–34.0)
MCHC: 33.4 g/dL (ref 30.0–36.0)
MCV: 96.1 fL (ref 80.0–100.0)
Monocytes Absolute: 0.5 10*3/uL (ref 0.1–1.0)
Monocytes Relative: 10 %
Neutro Abs: 4.1 10*3/uL (ref 1.7–7.7)
Neutrophils Relative %: 71 %
Platelets: 244 10*3/uL (ref 150–400)
RBC: 4.83 MIL/uL (ref 3.87–5.11)
RDW: 12.5 % (ref 11.5–15.5)
WBC: 5.7 10*3/uL (ref 4.0–10.5)
nRBC: 0 % (ref 0.0–0.2)

## 2020-10-03 LAB — SURGICAL PCR SCREEN
MRSA, PCR: NEGATIVE
Staphylococcus aureus: NEGATIVE

## 2020-10-03 LAB — URINALYSIS, ROUTINE W REFLEX MICROSCOPIC
Bilirubin Urine: NEGATIVE
Glucose, UA: NEGATIVE mg/dL
Hgb urine dipstick: NEGATIVE
Ketones, ur: NEGATIVE mg/dL
Leukocytes,Ua: NEGATIVE
Nitrite: NEGATIVE
Protein, ur: NEGATIVE mg/dL
Specific Gravity, Urine: 1.01 (ref 1.005–1.030)
pH: 6 (ref 5.0–8.0)

## 2020-10-03 LAB — COMPREHENSIVE METABOLIC PANEL
ALT: 23 U/L (ref 0–44)
AST: 25 U/L (ref 15–41)
Albumin: 4.2 g/dL (ref 3.5–5.0)
Alkaline Phosphatase: 55 U/L (ref 38–126)
Anion gap: 9 (ref 5–15)
BUN: 15 mg/dL (ref 8–23)
CO2: 28 mmol/L (ref 22–32)
Calcium: 9.7 mg/dL (ref 8.9–10.3)
Chloride: 102 mmol/L (ref 98–111)
Creatinine, Ser: 0.74 mg/dL (ref 0.44–1.00)
GFR, Estimated: >60 mL/min (ref >60)
Glucose, Bld: 85 mg/dL (ref 70–99)
Potassium: 3.9 mmol/L (ref 3.5–5.1)
Sodium: 139 mmol/L (ref 135–145)
Total Bilirubin: 0.8 mg/dL (ref 0.3–1.2)
Total Protein: 7.5 g/dL (ref 6.5–8.1)

## 2020-10-03 LAB — TYPE AND SCREEN
ABO/RH(D): O POS
Antibody Screen: NEGATIVE

## 2020-10-03 NOTE — Patient Instructions (Addendum)
Your procedure is scheduled on: 10-11-20 TUESDAY Report to Day Surgery on the 2nd floor of the [facility]. To find out your arrival time, please call [phone] between 1PM - 3PM on: 10-10-20 MONDAY  REMEMBER: Instructions that are not followed completely may result in serious medical risk, up to and including death; or upon the discretion of your surgeon and anesthesiologist your surgery may need to be rescheduled.  Do not eat food after midnight the night before surgery.  No gum chewing, lozengers or hard candies.  You may however, drink WATER up to 2 hours before you are scheduled to arrive for your surgery. Do not drink anything within 2 hours of your scheduled arrival time.  Type 1 and Type 2 diabetics should only drink water.  In addition, your doctor has ordered for you to drink the provided  Gatorade G2 Drinking this carbohydrate drink up to two hours before surgery helps to reduce insulin resistance and improve patient outcomes. Please complete drinking 2 hours prior to scheduled arrival time.  TAKE THESE MEDICATIONS THE MORNING OF SURGERY WITH A SIP OF WATER: -PRILOSEC (OMEPRAZOLE)-take one the night before and one on the morning of surgery - helps to prevent nausea after surgery.)   Follow recommendations from Cardiologist, Pulmonologist or PCP regarding stopping Aspirin, Coumadin, Plavix, Eliquis, Pradaxa, or Pletal-STOP YOUR ASPIRIN NOW   One week prior to surgery: Stop Anti-inflammatories (NSAIDS) such as Advil, Aleve, Ibuprofen, Motrin, Naproxen, Naprosyn and Aspirin based products such as Excedrin, Goodys Powder, BC Powder-OK TO CONTINUE CELEBREX-DO NOT TAKE THE MORNING OF SURGERY-OK TO TAKE TYLENOL IF NEEDED  Stop ANY OVER THE COUNTER supplements until after surgery-STOP YOUR VITAMIN C NOW-YOU MAY RESUME AFTER SURGERY (You may continue taking multivitamin.)  No Alcohol for 24 hours before or after surgery.  No Smoking including e-cigarettes for 24 hours prior to  surgery.  No chewable tobacco products for at least 6 hours prior to surgery.  No nicotine patches on the day of surgery.  Do not use any "recreational" drugs for at least a week prior to your surgery.  Please be advised that the combination of cocaine and anesthesia may have negative outcomes, up to and including death. If you test positive for cocaine, your surgery will be cancelled.  On the morning of surgery brush your teeth with toothpaste and water, you may rinse your mouth with mouthwash if you wish. Do not swallow any toothpaste or mouthwash.  Do not wear jewelry, make-up, hairpins, clips or nail polish.  Do not wear lotions, powders, or perfumes.   Do not shave 48 hours prior to surgery.   Contact lenses, hearing aids and dentures may not be worn into surgery.  Do not bring valuables to the hospital. [facility] is not responsible for any missing/lost belongings or valuables.   Use CHG Soap as directed on instruction sheet.  Notify your doctor if there is any change in your medical condition (cold, fever, infection).  Wear comfortable clothing (specific to your surgery type) to the hospital.  Plan for stool softeners for home use; pain medications have a tendency to cause constipation. You can also help prevent constipation by eating foods high in fiber such as fruits and vegetables and drinking plenty of fluids as your diet allows.  After surgery, you can help prevent lung complications by doing breathing exercises.  Take deep breaths and cough every 1-2 hours. Your doctor may order a device called an Incentive Spirometer to help you take deep breaths. When coughing  or sneezing, hold a pillow firmly against your incision with both hands. This is called "splinting." Doing this helps protect your incision. It also decreases belly discomfort.  If you are being admitted to the hospital overnight, leave your suitcase in the car. After surgery it may be brought to your  room.  If you are being discharged the day of surgery, you will not be allowed to drive home. You will need a responsible adult (18 years or older) to drive you home and stay with you that night.   If you are taking public transportation, you will need to have a responsible adult (18 years or older) with you. Please confirm with your physician that it is acceptable to use public transportation.   Please call the [facility] at [phone] if you have any questions about these instructions.  Visitation Policy:  Patients undergoing a surgery or procedure may have one family member or support person with them as long as that person is not COVID-19 positive or experiencing its symptoms.  That person may remain in the waiting area during the procedure.  Inpatient Visitation Update:   In an effort to ensure the safety of our team members and our patients, we are implementing a change to our visitation policy:  Effective Monday, Aug. 9, at 7 a.m., inpatients will be allowed one support person.  o The support person may change daily.  o The support person must pass our screening, gel in and out, and wear a mask at all times, including in the patient's room.  o Patients must also wear a mask when staff or their support person are in the room.  o Masking is required regardless of vaccination status.  Systemwide, no visitors 17 or younger.

## 2020-10-07 ENCOUNTER — Other Ambulatory Visit
Admission: RE | Admit: 2020-10-07 | Discharge: 2020-10-07 | Disposition: A | Discharge status: Home / Self Care | Payer: Medicare HMO | Source: Ambulatory Visit | Attending: Surgery | Admitting: Surgery

## 2020-10-07 ENCOUNTER — Other Ambulatory Visit: Payer: Self-pay

## 2020-10-07 DIAGNOSIS — Z20822 Contact with and (suspected) exposure to covid-19: Secondary | ICD-10-CM | POA: Insufficient documentation

## 2020-10-07 DIAGNOSIS — Z01812 Encounter for preprocedural laboratory examination: Secondary | ICD-10-CM | POA: Insufficient documentation

## 2020-10-08 LAB — SARS CORONAVIRUS 2 (TAT 6-24 HRS): SARS Coronavirus 2: NEGATIVE

## 2020-10-10 ENCOUNTER — Encounter: Payer: Self-pay | Admitting: Surgery

## 2020-10-11 ENCOUNTER — Ambulatory Visit
Admission: RE | Admit: 2020-10-11 | Discharge: 2020-10-11 | Disposition: A | Discharge status: Home / Self Care | Payer: Medicare HMO | Attending: Surgery | Admitting: Surgery

## 2020-10-11 ENCOUNTER — Encounter: Payer: Self-pay | Admitting: Surgery

## 2020-10-11 ENCOUNTER — Other Ambulatory Visit: Payer: Self-pay

## 2020-10-11 ENCOUNTER — Ambulatory Visit: Payer: Medicare HMO

## 2020-10-11 ENCOUNTER — Encounter
Admission: RE | Disposition: A | Discharge status: Home / Self Care | Payer: Self-pay | Source: Home / Self Care | Attending: Surgery

## 2020-10-11 ENCOUNTER — Ambulatory Visit: Payer: Medicare HMO | Admitting: Certified Registered Nurse Anesthetist

## 2020-10-11 DIAGNOSIS — E119 Type 2 diabetes mellitus without complications: Secondary | ICD-10-CM | POA: Diagnosis not present

## 2020-10-11 DIAGNOSIS — X58XXXA Exposure to other specified factors, initial encounter: Secondary | ICD-10-CM | POA: Diagnosis not present

## 2020-10-11 DIAGNOSIS — Z87891 Personal history of nicotine dependence: Secondary | ICD-10-CM | POA: Diagnosis not present

## 2020-10-11 DIAGNOSIS — Z85820 Personal history of malignant melanoma of skin: Secondary | ICD-10-CM | POA: Diagnosis not present

## 2020-10-11 DIAGNOSIS — K219 Gastro-esophageal reflux disease without esophagitis: Secondary | ICD-10-CM | POA: Diagnosis not present

## 2020-10-11 DIAGNOSIS — S83231A Complex tear of medial meniscus, current injury, right knee, initial encounter: Secondary | ICD-10-CM | POA: Diagnosis not present

## 2020-10-11 DIAGNOSIS — Z96651 Presence of right artificial knee joint: Secondary | ICD-10-CM

## 2020-10-11 DIAGNOSIS — G4730 Sleep apnea, unspecified: Secondary | ICD-10-CM | POA: Diagnosis not present

## 2020-10-11 DIAGNOSIS — Z8612 Personal history of poliomyelitis: Secondary | ICD-10-CM | POA: Diagnosis not present

## 2020-10-11 DIAGNOSIS — M1711 Unilateral primary osteoarthritis, right knee: Secondary | ICD-10-CM | POA: Insufficient documentation

## 2020-10-11 HISTORY — PX: TOTAL KNEE ARTHROPLASTY: SHX125

## 2020-10-11 LAB — ABO/RH: ABO/RH(D): O POS

## 2020-10-11 LAB — GLUCOSE, CAPILLARY
Glucose-Capillary: 111 mg/dL — ABNORMAL HIGH (ref 70–99)
Glucose-Capillary: 93 mg/dL (ref 70–99)

## 2020-10-11 SURGERY — ARTHROPLASTY, KNEE, TOTAL
Anesthesia: Spinal | Site: Knee | Laterality: Right

## 2020-10-11 MED ORDER — BUPIVACAINE HCL (PF) 0.5 % IJ SOLN
INTRAMUSCULAR | Status: AC
  Filled 2020-10-11: qty 10

## 2020-10-11 MED ORDER — SODIUM CHLORIDE 0.9 % IV SOLN: INTRAVENOUS | Status: DC

## 2020-10-11 MED ORDER — SODIUM CHLORIDE 0.9 % IV BOLUS
500.0000 mL | Freq: Once | INTRAVENOUS | Status: AC
  Administered 2020-10-11: 500 mL via INTRAVENOUS

## 2020-10-11 MED ORDER — ONDANSETRON HCL 4 MG/2ML IJ SOLN
INTRAMUSCULAR | Status: AC
  Filled 2020-10-11: qty 2

## 2020-10-11 MED ORDER — KETOROLAC TROMETHAMINE 15 MG/ML IJ SOLN: 7.5000 mg | Freq: Four times a day (QID) | INTRAMUSCULAR | Status: DC

## 2020-10-11 MED ORDER — DEXAMETHASONE SODIUM PHOSPHATE 10 MG/ML IJ SOLN
INTRAMUSCULAR | Status: AC
  Filled 2020-10-11: qty 1

## 2020-10-11 MED ORDER — ACETAMINOPHEN 10 MG/ML IV SOLN
INTRAVENOUS | Status: DC | PRN
  Administered 2020-10-11: 1000 mg via INTRAVENOUS

## 2020-10-11 MED ORDER — CEFAZOLIN SODIUM-DEXTROSE 2-4 GM/100ML-% IV SOLN
2.0000 g | INTRAVENOUS | Status: AC
  Administered 2020-10-11: 2 g via INTRAVENOUS

## 2020-10-11 MED ORDER — SODIUM CHLORIDE 0.9 % BOLUS PEDS
250.0000 mL | Freq: Once | INTRAVENOUS | Status: AC
  Administered 2020-10-11: 250 mL via INTRAVENOUS

## 2020-10-11 MED ORDER — ONDANSETRON HCL 4 MG/2ML IJ SOLN: 4.0000 mg | Freq: Once | INTRAMUSCULAR | Status: DC | PRN

## 2020-10-11 MED ORDER — METOCLOPRAMIDE HCL 5 MG/ML IJ SOLN: 5.0000 mg | Freq: Three times a day (TID) | INTRAMUSCULAR | Status: DC | PRN

## 2020-10-11 MED ORDER — ORAL CARE MOUTH RINSE: 15.0000 mL | Freq: Once | OROMUCOSAL | Status: AC

## 2020-10-11 MED ORDER — KETOROLAC TROMETHAMINE 15 MG/ML IJ SOLN
15.0000 mg | Freq: Once | INTRAMUSCULAR | Status: AC
  Administered 2020-10-11: 15 mg via INTRAVENOUS

## 2020-10-11 MED ORDER — KETOROLAC TROMETHAMINE 30 MG/ML IJ SOLN
INTRAMUSCULAR | Status: AC
  Administered 2020-10-11: 30 mg
  Filled 2020-10-11: qty 1

## 2020-10-11 MED ORDER — PROPOFOL 500 MG/50ML IV EMUL
INTRAVENOUS | Status: AC
  Filled 2020-10-11: qty 50

## 2020-10-11 MED ORDER — CHLORHEXIDINE GLUCONATE 0.12 % MT SOLN: 15.0000 mL | Freq: Once | OROMUCOSAL | Status: AC

## 2020-10-11 MED ORDER — PROPOFOL 10 MG/ML IV BOLUS
INTRAVENOUS | Status: AC
  Filled 2020-10-11: qty 20

## 2020-10-11 MED ORDER — TRANEXAMIC ACID 1000 MG/10ML IV SOLN
INTRAVENOUS | Status: AC
  Filled 2020-10-11: qty 10

## 2020-10-11 MED ORDER — FENTANYL CITRATE (PF) 100 MCG/2ML IJ SOLN: 25.0000 ug | INTRAMUSCULAR | Status: DC | PRN

## 2020-10-11 MED ORDER — ACETAMINOPHEN 500 MG PO TABS
1000.0000 mg | ORAL_TABLET | Freq: Four times a day (QID) | ORAL | Status: DC
  Administered 2020-10-11: 1000 mg via ORAL

## 2020-10-11 MED ORDER — SODIUM CHLORIDE FLUSH 0.9 % IV SOLN
INTRAVENOUS | Status: AC
  Filled 2020-10-11: qty 40

## 2020-10-11 MED ORDER — FENTANYL CITRATE (PF) 100 MCG/2ML IJ SOLN
INTRAMUSCULAR | Status: AC
  Filled 2020-10-11: qty 2

## 2020-10-11 MED ORDER — MIDAZOLAM HCL 2 MG/2ML IJ SOLN
INTRAMUSCULAR | Status: AC
  Filled 2020-10-11: qty 2

## 2020-10-11 MED ORDER — PROPOFOL 500 MG/50ML IV EMUL
INTRAVENOUS | Status: DC | PRN
  Administered 2020-10-11: 100 ug/kg/min via INTRAVENOUS

## 2020-10-11 MED ORDER — ACETAMINOPHEN 500 MG PO TABS
ORAL_TABLET | ORAL | Status: AC
  Filled 2020-10-11: qty 2

## 2020-10-11 MED ORDER — BUPIVACAINE-EPINEPHRINE (PF) 0.5% -1:200000 IJ SOLN
INTRAMUSCULAR | Status: AC
  Filled 2020-10-11: qty 30

## 2020-10-11 MED ORDER — CHLORHEXIDINE GLUCONATE 0.12 % MT SOLN
OROMUCOSAL | Status: AC
  Administered 2020-10-11: 15 mL via OROMUCOSAL
  Filled 2020-10-11: qty 15

## 2020-10-11 MED ORDER — ONDANSETRON HCL 4 MG PO TABS: 4.0000 mg | ORAL_TABLET | Freq: Four times a day (QID) | ORAL | Status: DC | PRN

## 2020-10-11 MED ORDER — BUPIVACAINE LIPOSOME 1.3 % IJ SUSP
INTRAMUSCULAR | Status: AC
  Filled 2020-10-11: qty 20

## 2020-10-11 MED ORDER — KETOROLAC TROMETHAMINE 15 MG/ML IJ SOLN
INTRAMUSCULAR | Status: AC
  Filled 2020-10-11: qty 1

## 2020-10-11 MED ORDER — ACETAMINOPHEN 10 MG/ML IV SOLN
INTRAVENOUS | Status: AC
  Filled 2020-10-11: qty 100

## 2020-10-11 MED ORDER — METOCLOPRAMIDE HCL 10 MG PO TABS: 5.0000 mg | ORAL_TABLET | Freq: Three times a day (TID) | ORAL | Status: DC | PRN

## 2020-10-11 MED ORDER — TRANEXAMIC ACID 1000 MG/10ML IV SOLN
INTRAVENOUS | Status: DC | PRN
  Administered 2020-10-11: 1000 mg via TOPICAL

## 2020-10-11 MED ORDER — ONDANSETRON HCL 4 MG/2ML IJ SOLN: 4.0000 mg | Freq: Four times a day (QID) | INTRAMUSCULAR | Status: DC | PRN

## 2020-10-11 MED ORDER — BUPIVACAINE HCL (PF) 0.5 % IJ SOLN
INTRAMUSCULAR | Status: DC | PRN
  Administered 2020-10-11: 3 mL

## 2020-10-11 MED ORDER — SODIUM CHLORIDE 0.9 % IV SOLN
INTRAVENOUS | Status: DC | PRN
  Administered 2020-10-11: 20 ug/min via INTRAVENOUS

## 2020-10-11 MED ORDER — FENTANYL CITRATE (PF) 100 MCG/2ML IJ SOLN
INTRAMUSCULAR | Status: DC | PRN
  Administered 2020-10-11: 50 ug via INTRAVENOUS

## 2020-10-11 MED ORDER — SODIUM CHLORIDE 0.9 % IV SOLN
INTRAVENOUS | Status: DC | PRN
  Administered 2020-10-11: 60 mL

## 2020-10-11 MED ORDER — LIDOCAINE HCL (PF) 2 % IJ SOLN
INTRAMUSCULAR | Status: AC
  Filled 2020-10-11: qty 5

## 2020-10-11 MED ORDER — CEFAZOLIN SODIUM-DEXTROSE 2-4 GM/100ML-% IV SOLN
INTRAVENOUS | Status: AC
  Filled 2020-10-11: qty 100

## 2020-10-11 MED ORDER — CEFAZOLIN SODIUM-DEXTROSE 2-4 GM/100ML-% IV SOLN
2.0000 g | Freq: Four times a day (QID) | INTRAVENOUS | Status: DC
  Administered 2020-10-11: 2 g via INTRAVENOUS

## 2020-10-11 MED ORDER — OXYCODONE HCL 5 MG PO TABS: 5.0000 mg | ORAL_TABLET | ORAL | Status: DC | PRN

## 2020-10-11 MED ORDER — BUPIVACAINE-EPINEPHRINE (PF) 0.5% -1:200000 IJ SOLN
INTRAMUSCULAR | Status: DC | PRN
  Administered 2020-10-11: 30 mL

## 2020-10-11 SURGICAL SUPPLY — 58 items
APL PRP STRL LF DISP 70% ISPRP (MISCELLANEOUS) ×1
BLADE SAW SAG 25X90X1.19 (BLADE) ×3 IMPLANT
BLADE SURG SZ20 CARB STEEL (BLADE) ×3 IMPLANT
BNDG CMPR STD VLCR NS LF 5.8X6 (GAUZE/BANDAGES/DRESSINGS) ×1
BNDG ELASTIC 6X5.8 VLCR NS LF (GAUZE/BANDAGES/DRESSINGS) ×3 IMPLANT
BRNG TIB 0D 75X10 ANT STAB (Insert) ×1 IMPLANT
CANISTER SUCT 1200ML W/VALVE (MISCELLANEOUS) ×3 IMPLANT
CANISTER SUCT 3000ML PPV (MISCELLANEOUS) ×3 IMPLANT
CEMENT BONE R 1X40 (Cement) ×6 IMPLANT
CEMENT VACUUM MIXING SYSTEM (MISCELLANEOUS) ×3 IMPLANT
CHLORAPREP W/TINT 26 (MISCELLANEOUS) ×3 IMPLANT
COOLER POLAR GLACIER W/PUMP (MISCELLANEOUS) ×3 IMPLANT
COVER MAYO STAND REUSABLE (DRAPES) ×3 IMPLANT
COVER WAND RF STERILE (DRAPES) ×3 IMPLANT
CUFF TOURN SGL QUICK 30 (TOURNIQUET CUFF) ×3
CUFF TRNQT CYL 30X4X21-28X (TOURNIQUET CUFF) ×1 IMPLANT
DRAPE 3/4 80X56 (DRAPES) ×3 IMPLANT
DRAPE IMP U-DRAPE 54X76 (DRAPES) ×3 IMPLANT
DRSG OPSITE POSTOP 4X10 (GAUZE/BANDAGES/DRESSINGS) ×3 IMPLANT
DRSG OPSITE POSTOP 4X8 (GAUZE/BANDAGES/DRESSINGS) ×3 IMPLANT
ELECT REM PT RETURN 9FT ADLT (ELECTROSURGICAL) ×3
ELECTRODE REM PT RTRN 9FT ADLT (ELECTROSURGICAL) ×1 IMPLANT
FEMORAL COMPONENT 70MM RIGHT (Joint) ×3 IMPLANT
GLOVE BIO SURGEON STRL SZ7.5 (GLOVE) ×12 IMPLANT
GLOVE BIO SURGEON STRL SZ8 (GLOVE) ×12 IMPLANT
GLOVE BIOGEL PI IND STRL 8 (GLOVE) ×1 IMPLANT
GLOVE BIOGEL PI INDICATOR 8 (GLOVE) ×2
GLOVE INDICATOR 8.0 STRL GRN (GLOVE) ×3 IMPLANT
GOWN STRL REUS W/ TWL LRG LVL3 (GOWN DISPOSABLE) ×2 IMPLANT
GOWN STRL REUS W/ TWL XL LVL3 (GOWN DISPOSABLE) ×1 IMPLANT
GOWN STRL REUS W/TWL LRG LVL3 (GOWN DISPOSABLE) ×6
GOWN STRL REUS W/TWL XL LVL3 (GOWN DISPOSABLE) ×3
HOOD PEEL AWAY FLYTE STAYCOOL (MISCELLANEOUS) ×9 IMPLANT
INSERT TIB BEARING 75 10 (Insert) ×3 IMPLANT
KIT TURNOVER KIT A (KITS) ×3 IMPLANT
NEEDLE SPNL 20GX3.5 QUINCKE YW (NEEDLE) ×3 IMPLANT
NS IRRIG 1000ML POUR BTL (IV SOLUTION) ×3 IMPLANT
PACK TOTAL KNEE (MISCELLANEOUS) ×3 IMPLANT
PAD WRAPON POLAR KNEE (MISCELLANEOUS) ×1 IMPLANT
PATELLA STD 34X8.5 (Orthopedic Implant) ×3 IMPLANT
PENCIL ELECTRO HAND CTR (MISCELLANEOUS) ×3 IMPLANT
PENCIL SMOKE EVACUATOR (MISCELLANEOUS) ×3 IMPLANT
PLATE KNEE TIBIAL 75MM FIXED (Plate) ×3 IMPLANT
PULSAVAC PLUS IRRIG FAN TIP (DISPOSABLE) ×3
SOL .9 NS 3000ML IRR  AL (IV SOLUTION) ×2
SOL .9 NS 3000ML IRR AL (IV SOLUTION) ×1
SOL .9 NS 3000ML IRR UROMATIC (IV SOLUTION) ×1 IMPLANT
STAPLER SKIN PROX 35W (STAPLE) ×3 IMPLANT
SUCTION FRAZIER HANDLE 10FR (MISCELLANEOUS) ×2
SUCTION TUBE FRAZIER 10FR DISP (MISCELLANEOUS) ×1 IMPLANT
SUT VIC AB 0 CT1 36 (SUTURE) ×9 IMPLANT
SUT VIC AB 2-0 CT1 27 (SUTURE) ×12
SUT VIC AB 2-0 CT1 TAPERPNT 27 (SUTURE) ×4 IMPLANT
SYR 10ML LL (SYRINGE) ×3 IMPLANT
SYR 20ML LL LF (SYRINGE) ×3 IMPLANT
SYR 30ML LL (SYRINGE) ×3 IMPLANT
TIP FAN IRRIG PULSAVAC PLUS (DISPOSABLE) ×1 IMPLANT
WRAPON POLAR PAD KNEE (MISCELLANEOUS) ×3

## 2020-10-11 NOTE — Anesthesia Preprocedure Evaluation (Signed)
Anesthesia Evaluation  Patient identified by MRN, date of birth, ID band Patient awake    Reviewed: Allergy & Precautions, NPO status , Patient's Chart, lab work & pertinent test results  History of Anesthesia Complications Negative for: history of anesthetic complications  Airway Mallampati: II  TM Distance: >3 FB Neck ROM: Full    Dental no notable dental hx.    Pulmonary sleep apnea (diagnosed mild, not prescribed CPAP) , neg COPD, former smoker,    breath sounds clear to auscultation- rhonchi (-) wheezing      Cardiovascular Exercise Tolerance: Good (-) hypertension(-) CAD, (-) Past MI, (-) Cardiac Stents and (-) CABG  Rhythm:Regular Rate:Normal - Systolic murmurs and - Diastolic murmurs    Neuro/Psych neg Seizures negative neurological ROS  negative psych ROS   GI/Hepatic Neg liver ROS, GERD  ,  Endo/Other  diabetes (diet controlled)  Renal/GU negative Renal ROS     Musculoskeletal  (+) Arthritis ,   Abdominal (+) + obese,   Peds  Hematology negative hematology ROS (+)   Anesthesia Other Findings Past Medical History: No date: Arthritis     Comment:  knees No date: Cancer ([place])     Comment:  melanoma/skin ca No date: Diabetes mellitus without complication (HCC)     Comment:  TYPE 2 No date: Dysphagia No date: GERD (gastroesophageal reflux disease) No date: History of colon polyps No date: Motion sickness     Comment:  ocean boats No date: 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 No date: Polio     Comment:  AGE 16 No date: Restless leg syndrome No date: Restless leg syndrome No date: Sleep apnea     Comment:  BORDERLINE PER PT-NO MACHINE   Reproductive/Obstetrics                             Lab Results  Component Value Date   WBC 5.7 10/03/2020   HGB 15.5 (H) 10/03/2020   HCT 46.4 (H) 10/03/2020   MCV 96.1 10/03/2020   PLT 244 10/03/2020    Anesthesia Physical Anesthesia  Plan  ASA: II  Anesthesia Plan: Spinal   Post-op Pain Management:    Induction:   PONV Risk Score and Plan: 2 and Propofol infusion  Airway Management Planned: Natural Airway  Additional Equipment:   Intra-op Plan:   Post-operative Plan:   Informed Consent: I have reviewed the patients History and Physical, chart, labs and discussed the procedure including the risks, benefits and alternatives for the proposed anesthesia with the patient or authorized representative who has indicated his/her understanding and acceptance.     Dental advisory given  Plan Discussed with: CRNA and Anesthesiologist  Anesthesia Plan Comments:         Anesthesia Quick Evaluation

## 2020-10-11 NOTE — OR Nursing (Signed)
PT in for evaluation @ 1403.  Patient advises she has rolling walker and BSC at home already.

## 2020-10-11 NOTE — Evaluation (Signed)
Physical Therapy Evaluation Patient Details Name: Sherry Vaughan MRN: [id number] DOB: 31-Oct-1946 Today's Date: 10/11/2020   History of Present Illness  Sherry Vaughan is a 74 year old female with history of R knee pain secondary to a radial tear involving posterior portion of medical meniscus with underlying degenerative joint disease. She has tried steroid injections but has no lasting effect. She is now s/p R TKA 10/26.    Clinical Impression  Pt presents in fowler's position on arrival to room and is agreeable to PT evaluation. After obtaining a history and performing some supine exercises, pt starts feeling nauseous and becoming pale. Pt's BP was 81/49 and then 71/46 with a HR of 44. Nurse was notified and paused PT session. After vitals steady, pt continued with session about 30 minutes later. Pt's BP increased to 135/73 supine, 169/74 sitting EOB, and 138/108 standing. She was minA+2 for bed mobility, minA+2 for sit<>stand transfer, and CGA+2 for ambulation. Patient was +2 assist and utilized chair follow during ambulation for safety precautions. Pt educated about car transfers, polar care, and weight bearing precautions. VCs utilized throughout for proper hand placement during transfers. Pt [facility] on [facility] with her sister and nursing present. Pt currently presents with deficits in strength, mobility, functional activity tolerance, and balance. Pt would benefit from skilled PT during acute stay to address aforementioned deficits and HHPT at discharge to optimize return to PLOF and maximize functional mobility. This entire session was guided, instructed, and directly supervised by Greggory Stallion, DPT.     Follow Up Recommendations Home health PT;Supervision for mobility/OOB    Equipment Recommendations  None recommended by PT    Recommendations for Other Services       Precautions / Restrictions Precautions Precautions: Fall Restrictions Weight Bearing Restrictions: Yes RLE Weight Bearing:  Weight bearing as tolerated      Mobility  Bed Mobility Overal bed mobility: Needs Assistance Bed Mobility: Supine to Sit     Supine to sit: Min assist;+2 for safety/equipment     General bed mobility comments: Patient needed some assistance with LEs and used therapists hands for support when coming up to sitting. VCs utilized throughout for proper hand placement and technique. Patient was +2 for safety due to fluctuating BP    Transfers Overall transfer level: Needs assistance Equipment used: Rolling walker (2 wheeled) Transfers: Sit to/from Stand Sit to Stand: Min assist;+2 safety/equipment         General transfer comment: Pt able to transfer with minA+2   Ambulation/Gait Ambulation/Gait assistance: Min guard;+2 safety/equipment Gait Distance (Feet): 130 Feet Assistive device: Rolling walker (2 wheeled) Gait Pattern/deviations: Step-to pattern;Decreased step length - right;Decreased step length - left     General Gait Details: Pt ambulated with step to pattern, decreased gait speed, and heavy WBing through BUE on RW. No overt LOB observed during session.  Stairs            Wheelchair Mobility    Modified Rankin (Stroke Patients Only)       Balance Overall balance assessment: Needs assistance Sitting-balance support: Bilateral upper extremity supported;Feet unsupported Sitting balance-Leahy Scale: Good Sitting balance - Comments: Pt able to maintain seated balance without feet supported   Standing balance support: Bilateral upper extremity supported;During functional activity Standing balance-Leahy Scale: Good Standing balance comment: Pt able to maintain balance with BUE support on RW  Pertinent Vitals/Pain Pain Assessment: 0-10 Pain Score: 1  (increases to 2/10 with movement) Pain Location: R knee Pain Descriptors / Indicators: Aching;Tingling Pain Intervention(s): Monitored during session;RN gave pain meds  during session;Ice applied;Repositioned;Premedicated before session;Limited activity within patient's tolerance    Home Living Family/patient expects to be discharged to:: Private residence Living Arrangements: Children (Son in Sports coach) Available Help at Discharge: Family;Available PRN/intermittently (Sisters PRN) Type of Home: House Home Access: Ramped entrance     Home Layout: Able to live on main level with bedroom/bathroom;Multi-level Home Equipment: Walker - 2 wheels;Walker - 4 wheels;Cane - single point;Bedside commode;Shower seat Additional Comments: Patient notes she will be showering at her sister's house which is a walk in shower and has a shower seat with grab bars and hand-held shower head    Prior Function Level of Independence: Independent         Comments: Pt was walking with no AD, working, and driving. She reports 1 fall but she was knocked down by a lawnmower     Hand Dominance        Extremity/Trunk Assessment   Upper Extremity Assessment Upper Extremity Assessment: Overall WFL for tasks assessed (Generally 4+/5 UE strength)    Lower Extremity Assessment Lower Extremity Assessment: Overall WFL for tasks assessed;RLE deficits/detail (Patient generally 4/5 on LLE) RLE: Unable to fully assess due to pain RLE Sensation: decreased light touch       Communication   Communication: No difficulties  Cognition Arousal/Alertness: Awake/alert;Lethargic (Pt lethargic during session due to low blood pressure) Behavior During Therapy: WFL for tasks assessed/performed Overall Cognitive Status: Within Functional Limits for tasks assessed                                 General Comments: Patient is a&Ox4      General Comments General comments (skin integrity, edema, etc.): Patient's BP monitored due to sx of nausea, paleness, feeling hot during supine exercises. Pt's BP dropped to 81/49 then 76/46 with a HR of 44. After 30 minutes of resting and supine in  bed, pt's BP increased to 135/73 supine, 169/74 sitting EOB, and 138/108 standing.    Exercises Total Joint Exercises Ankle Circles/Pumps: AROM;Both;10 reps;Supine Quad Sets: AROM;Right;10 reps;Supine Short Arc Quad: AAROM;Right;10 reps;Seated Heel Slides: AAROM;Right;10 reps;Seated Hip ABduction/ADduction: AAROM;Right;10 reps;Supine Straight Leg Raises: AAROM;Right;10 reps;Supine Long Arc Quad: AAROM;Right;10 reps;Seated Knee Flexion: AAROM;Right;10 reps;Seated Goniometric ROM: R knee: Extension: 17 degrees; Flexion: 89 degrees Marching in Standing: AROM;Both;10 reps;Standing Other Exercises Other Exercises: Pt transfered to [facility] with minA+2 for safety   Assessment/Plan    PT Assessment Patient needs continued PT services  PT Problem List Decreased strength;Decreased range of motion;Decreased activity tolerance;Decreased balance;Decreased mobility;Decreased coordination;Cardiopulmonary status limiting activity;Impaired sensation;Pain       PT Treatment Interventions DME instruction;Gait training;Stair training;Functional mobility training;Therapeutic activities;Therapeutic exercise;Balance training;Neuromuscular re-education    PT Goals (Current goals can be found in the Care Plan section)  Acute Rehab PT Goals Patient Stated Goal: "to go home" PT Goal Formulation: With patient Time For Goal Achievement: 10/11/20 Potential to Achieve Goals: Fair    Frequency BID   Barriers to discharge        Co-evaluation               AM-PAC PT "6 Clicks" Mobility  Outcome Measure Help needed turning from your back to your side while in a flat bed without using bedrails?: A Little Help needed moving from  lying on your back to sitting on the side of a flat bed without using bedrails?: A Little Help needed moving to and from a bed to a chair (including a wheelchair)?: A Little Help needed standing up from a chair using your arms (e.g., wheelchair or bedside chair)?: A Little Help  needed to walk in hospital room?: A Lot Help needed climbing 3-5 steps with a railing? : A Lot 6 Click Score: 16    End of Session Equipment Utilized During Treatment: Gait belt Activity Tolerance: Patient tolerated treatment well;Treatment limited secondary to medical complications (Comment) (BP dropping during supine exercises) Patient left: in chair;with nursing/sitter in room;with family/visitor present Nurse Communication: Mobility status PT Visit Diagnosis: Unsteadiness on feet (R26.81);Other abnormalities of gait and mobility (R26.89);Muscle weakness (generalized) (M62.81);Difficulty in walking, not elsewhere classified (R26.2);Pain Pain - Right/Left: Right Pain - part of body: Knee    Time: 1402 [phone] ([id number]) PT Time Calculation (min) (ACUTE ONLY): 98 min   Charges:   PT Evaluation $PT Eval Moderate Complexity: 1 Mod PT Treatments $Gait Training: 23-37 mins $Therapeutic Exercise: 8-22 mins       Sherry Vaughan, SPT Bernita Raisin 10/11/2020, 4:46 PM

## 2020-10-11 NOTE — H&P (Signed)
History of Present Illness:  Sherry Vaughan is a 74 y.o. female who presents for follow-up of her right knee pain secondary to a radial tear involving the posterior portion of the medial meniscus with underlying degenerative joint disease. The patient was last seen for the symptoms 4 weeks ago. At this visit, she received a steroid injection into the right knee which she states provided substantial relief of her symptoms, but only lasted about 2 weeks before her symptoms began to recur. She again notes moderate discomfort in her knee which is worse with any prolonged standing or ambulation, as well as with certain twisting motions. She has difficulty reciprocating stairs. Her symptoms also tend to worsen during the course of the day. She denies any reinjury to the knee, and denies any numbness or paresthesias down her leg to her foot.  Current Outpatient Medications: . ascorbic acid (VITAMIN C ORAL) Take by mouth  . aspirin 81 MG EC tablet Take 81 mg by mouth once daily.  . cholecalciferol (VITAMIN D3) 1000 unit capsule Take 1,000 Units by mouth once daily  . mv,calcium,min/iron/folic/vitK (ONE-A-DAY WOMEN'S COMPLETE ORAL) Take by mouth  . omeprazole (PRILOSEC) 40 MG DR capsule TAKE 1 CAPSULE ONCE DAILY 90 capsule 3  . rosuvastatin (CRESTOR) 5 MG tablet TAKE 1 TABLET ONCE DAILY 90 tablet 3   Allergies:  . Sulfa (Sulfonamide Antibiotics) Vomiting   Past Medical History:  . Cough  dyspnea  . GERD (gastroesophageal reflux disease)  mild  . Hx of adenomatous colonic polyps  per colonoscopy 2005  . Hx of endometriosis  . Inflammation of colonic mucosa, unspecified 2005  with lymphoid nodule from ileocecal valve  . Menopause  . Other dysphagia 12/25/2016  . Reflux  question LPR and VCD  . RLS (restless legs syndrome)  . Sleep apnea 2005  mild, no treatment   Past Surgical History:  . Bunionectomy  . COLONOSCOPY 12/28/2013, 11/23/2008, 11/17/2004, 08/24/1994  Sessile Serrated Adenoma, FH  Colon Polyps (Brother): [facility] 12/2016; Recall Ltr mailed 11/06/2016 (dw)  . COLONOSCOPY 03/04/2017  PH Adenomatous Polyps, FH Colon Polyps (Brother): [facility] 02/2022  . EGD 12/28/2013, 11/23/2008, 03/11/2002  H Pylori  . EGD 03/04/2017  Gastritis: No repeat per RTE  . KNEE ARTHROSCOPY Left 08/26/2015  . Laparoscopy for endometriosis  . Melanoma excision   Family History:  Marland Kitchen Myocardial Infarction (Heart attack) Mother  . Diabetes Mother  . High blood pressure (Hypertension) Mother  . Myocardial Infarction (Heart attack) Father  . High blood pressure (Hypertension) Father  . Diabetes Sister  . Other Sister  Sister - Wolff-Parkinson-White syndrome  . Kidney cancer Brother  . Diabetes Brother  . Breast cancer Maternal Aunt  . Breast cancer Paternal Aunt  . Diabetes Brother  . Leukemia Other  Grandfather   Social History:   Socioeconomic History:  Marland Kitchen Marital status: Married  Spouse name: Not on file  . Number of children: Not on file  . Years of education: Not on file  . Highest education level: Not on file  Occupational History  . Not on file  Tobacco Use  . Smoking status: Former Smoker  Years: 25.00  Types: Cigarettes  Quit date: 10/16/1982  Years since quitting: 37.9  . Smokeless tobacco: Never Used  Vaping Use  . Vaping Use: Never used  Substance and Sexual Activity  . Alcohol use: Yes  Alcohol/week: 0.0 standard drinks  Comment: occasional  . Drug use: No  . Sexual activity: Never  Other Topics Concern  . Not  on file  Social History Narrative  . Not on file   Social Determinants of Health:   Financial Resource Strain:  . Difficulty of Paying Living Expenses:  Food Insecurity:  . Worried About Charity fundraiser in the Last Year:  . Arboriculturist in the Last Year:  Transportation Needs:  . Film/video editor (Medical):  Marland Kitchen Lack of Transportation (Non-Medical):   Review of Systems:  A comprehensive 14 point ROS was performed, reviewed, and the  pertinent orthopaedic findings are documented in the HPI.  Physical Exam: Vitals:  09/16/20 0815  BP: 130/80  Weight: 98.2 kg (216 lb 9.6 oz)  Height: 162.6 cm (5\' 4" )  PainSc: 0-No pain  PainLoc: Knee   General/Constitutional: Pleasant overweight elderly female in no acute distress. Neuro/Psych: Normal mood and affect, oriented to person, place and time.  Eyes: Non-icteric. Pupils are equal, round, and reactive to light, and exhibit synchronous movement. ENT: Unremarkable. Lymphatic: No palpable adenopathy. Respiratory: Lungs clear to auscultation, Normal chest excursion, No wheezes and Non-labored breathing Cardiovascular: Regular rate and rhythm. No murmurs. and No edema, swelling or tenderness, except as noted in detailed exam. Integumentary: No impressive skin lesions present, except as noted in detailed exam. Musculoskeletal: Unremarkable, except as noted in detailed exam.  Right knee exam: GAIT: Mild limp, but uses no assistive devices. ALIGNMENT: Normal SKIN: Unremarkable SWELLING: Minimal EFFUSION: Trace WARMTH: None TENDERNESS: Moderate tenderness along medial and lateral joint lines ROM: 0-125 degrees with mild pain in maximal flexion McMURRAY'S: Positive PATELLOFEMORAL: Normal tracking with no peri-patellar tenderness and negative apprehension sign CREPITUS: Mild patellofemoral crepitance LACHMAN'S: Negative PIVOT SHIFT: Negative ANTERIOR DRAWER: Negative POSTERIOR DRAWER: Negative VARUS/VALGUS: Stable  She remains neurovascularly intact to the right lower extremity and foot.  Assessment: 1. Primary osteoarthritis of right knee. 2. Complex tear of medial meniscus of right knee.   Plan: The treatment options were discussed with the patient. In addition, patient educational materials were provided regarding the diagnosis and treatment options. The patient is quite frustrated by her recurrent symptoms and functional limitations, and is ready to consider more  aggressive treatment options. Therefore, I have recommended a surgical procedure, specifically a right total knee arthroplasty. The procedure was discussed with the patient, as were the potential risks (including bleeding, infection, nerve and/or blood vessel injury, persistent or recurrent pain, loosening and/or failure of the components, dislocation, need for further surgery, blood clots, strokes, heart attacks and/or arhythmias, pneumonia, etc.) and benefits. The patient states his/her understanding and wishes to proceed. All of the patient's questions and concerns were answered. She can call any time with further concerns. She will follow up post-surgery, routine.   H&P reviewed and patient re-examined. No changes.

## 2020-10-11 NOTE — Anesthesia Procedure Notes (Signed)
Date/Time: 10/11/2020 8:41 AM Performed by: Lily Peer, Justis Closser, CRNA Pre-anesthesia Checklist: Patient identified, Emergency Drugs available, Suction available and Patient being monitored Oxygen Delivery Method: Simple face mask

## 2020-10-11 NOTE — Discharge Instructions (Addendum)
Orthopedic discharge instructions: May shower with intact op-site dressing.  Apply ice frequently to knee or use Polar Care. Take Eliquis 2.5 mg BID for 2 weeks, then Aspirin 325 mg daily for 4 weeks.   BID = twice daily (every 12 hours) Take oxycodone as prescribed when needed.  May supplement with ES Tylenol if necessary. May weight-bear as tolerated on right leg - use walker as needed. Follow-up in 10-14 days or as scheduled.   Bupivacaine Liposomal Suspension for Injection What is this medicine? BUPIVACAINE LIPOSOMAL (bue PIV a kane LIP oh som al) is an anesthetic. It causes loss of feeling in the skin or other tissues. It is used to prevent and to treat pain from some procedures. This medicine may be used for other purposes; ask your health care provider or pharmacist if you have questions. COMMON BRAND NAME(S): EXPAREL What should I tell my health care provider before I take this medicine? They need to know if you have any of these conditions:  G6PD deficiency  heart disease  kidney disease  liver disease  low blood pressure  lung or breathing disease, like asthma  an unusual or allergic reaction to bupivacaine, other medicines, foods, dyes, or preservatives  pregnant or trying to get pregnant  breast-feeding How should I use this medicine? This medicine is for injection into the affected area. It is given by a health care professional in a hospital or clinic setting. Talk to your pediatrician regarding the use of this medicine in children. Special care may be needed. Overdosage: If you think you have taken too much of this medicine contact a poison control center or emergency room at once. NOTE: This medicine is only for you. Do not share this medicine with others. What if I miss a dose? This does not apply. What may interact with this medicine? This medicine may interact with the following medications:  acetaminophen  certain antibiotics like dapsone,  nitrofurantoin, aminosalicylic acid, sulfonamides  certain medicines for seizures like phenobarbital, phenytoin, valproic acid  chloroquine  cyclophosphamide  flutamide  hydroxyurea  ifosfamide  metoclopramide  nitric oxide  nitroglycerin  nitroprusside  nitrous oxide  other local anesthetics like lidocaine, pramoxine, tetracaine  primaquine  quinine  rasburicase  sulfasalazine This list may not describe all possible interactions. Give your health care provider a list of all the medicines, herbs, non-prescription drugs, or dietary supplements you use. Also tell them if you smoke, drink alcohol, or use illegal drugs. Some items may interact with your medicine. What should I watch for while using this medicine? Your condition will be monitored carefully while you are receiving this medicine. Be careful to avoid injury while the area is numb, and you are not aware of pain. What side effects may I notice from receiving this medicine? Side effects that you should report to your doctor or health care professional as soon as possible:  allergic reactions like skin rash, itching or hives, swelling of the face, lips, or tongue  seizures  signs and symptoms of a dangerous change in heartbeat or heart rhythm like chest pain; dizziness; fast, irregular heartbeat; palpitations; feeling faint or lightheaded; falls; breathing problems  signs and symptoms of methemoglobinemia such as pale, gray, or blue colored skin; headache; fast heartbeat; shortness of breath; feeling faint or lightheaded, falls; tiredness Side effects that usually do not require medical attention (report to your doctor or health care professional if they continue or are bothersome):  anxious  back pain  changes in taste  changes  in vision  constipation  dizziness  fever  nausea, vomiting This list may not describe all possible side effects. Call your doctor for medical advice about side effects.  You may report side effects to FDA at 1-800-FDA-1088. Where should I keep my medicine? This drug is given in a hospital or clinic and will not be stored at home. NOTE: This sheet is a summary. It may not cover all possible information. If you have questions about this medicine, talk to your doctor, pharmacist, or health care provider.  2020 Elsevier/Gold Standard (2019-09-15 10:48:23)   AMBULATORY SURGERY  DISCHARGE INSTRUCTIONS   1) The drugs that you were given will stay in your system until tomorrow so for the next 24 hours you should not:  A) Drive an automobile B) Make any legal decisions C) Drink any alcoholic beverage   2) You may resume regular meals tomorrow.  Today it is better to start with liquids and gradually work up to solid foods.  You may eat anything you prefer, but it is better to start with liquids, then soup and crackers, and gradually work up to solid foods.   3) Please notify your doctor immediately if you have any unusual bleeding, trouble breathing, redness and pain at the surgery site, drainage, fever, or pain not relieved by medication.    4) Additional Instructions:        Please contact your physician with any problems or Same Day Surgery at [phone], Monday through Friday 6 am to 4 pm, or [place] at [facility] number at [phone].

## 2020-10-11 NOTE — OR Nursing (Addendum)
PT advises approx 230 re pt's BP down, pt was in bed working with therapy on exercises, advised she felt little nauseaus.  Discussed with Dr. Randa Lynn, NS bolus started as ordered.  Update 1441 - BP 115/57, HR 55; patient states nausea improved.  Update 1525 - PT back in to work with patient; tolerated well.  Has rolling walker & BSC at home; had 2nd dose of tylenol, toradol, and ancef as ordered; tolerating po's and voided.  For discharge to home.

## 2020-10-11 NOTE — Anesthesia Procedure Notes (Signed)
Spinal  Patient location during procedure: OR Start time: 10/11/2020 7:33 AM End time: 10/11/2020 7:38 AM Staffing Performed: resident/CRNA  Anesthesiologist: Emmie Niemann, MD Resident/CRNA: Norm Salt, CRNA Preanesthetic Checklist Completed: patient identified, IV checked, site marked, risks and benefits discussed, surgical consent, monitors and equipment checked and pre-op evaluation Spinal Block Patient position: sitting Prep: ChloraPrep Patient monitoring: heart rate, continuous pulse ox and blood pressure Approach: midline Location: L3-4 Injection technique: single-shot Needle Needle type: Sprotte  Needle gauge: 24 G Needle length: 9 cm

## 2020-10-11 NOTE — Transfer of Care (Signed)
Immediate Anesthesia Transfer of Care Note  Patient: Sherry Vaughan  Procedure(s) Performed: TOTAL KNEE ARTHROPLASTY (Right Knee)  Patient Location: PACU  Anesthesia Type:General  Level of Consciousness: sedated  Airway & Oxygen Therapy: Patient Spontanous Breathing  Post-op Assessment: Report given to RN and Post -op Vital signs reviewed and stable  Post vital signs: Reviewed and stable  Last Vitals:  Vitals Value Taken Time  BP 94/53 10/11/20 0956  Temp    Pulse 62 10/11/20 0956  Resp 10 10/11/20 0956  SpO2 99 % 10/11/20 0956    Last Pain:  Vitals:   10/11/20 0956  TempSrc:   PainSc: 0-No pain         Complications: No complications documented.

## 2020-10-11 NOTE — Op Note (Signed)
10/11/2020  10:01 AM  Patient:   Sherry Vaughan  Pre-Op Diagnosis:   Degenerative joint disease, right knee.  Post-Op Diagnosis:   Same  Procedure:   Right TKA using all-cemented [employer] Vanguard system with a 70 mm PCR femur, a 75 mm tibial tray with a 10 mm anterior stabilized E-poly insert, and a 34 x 8.5 mm all-poly 3-pegged domed patella.  Surgeon:   Pascal Lux, MD  Assistant:   Cameron Proud, PA-C; Sherlene Shams, PA-S  Anesthesia:   Spinal  Findings:   As above  Complications:   None  EBL:   25 cc  Fluids:   900 cc crystalloid  UOP:   None  TT:   85 minutes at 300 mmHg  Drains:   None  Closure:   Staples  Implants:   As above  Brief Clinical Note:   The patient is a 74 year old female with a long history of progressively worsening right knee pain. The patient's symptoms have progressed despite medications, activity modification, injections, etc. The patient's history and examination were consistent with advanced degenerative joint disease of the right knee confirmed by plain radiographs. The patient presents at this time for a right total knee arthroplasty.  Procedure:   The patient was brought into the operating room. After adequate spinal anesthesia was obtained, the patient was lain in the supine position. The right lower extremity was prepped with ChloraPrep solution and draped sterilely. Preoperative antibiotics were administered. After verifying the proper laterality with a surgical timeout, the limb was exsanguinated with an Esmarch and the tourniquet inflated to 300 mmHg.   A standard anterior approach to the knee was made through an approximately 7 inch incision. The incision was carried down through the subcutaneous tissues to expose superficial retinaculum. This was split the length of the incision and the medial flap elevated sufficiently to expose the medial retinaculum. The medial retinaculum was incised, leaving a 3-4 mm cuff of tissue on the patella. This  was extended distally along the medial border of the patellar tendon and proximally through the medial third of the quadriceps tendon. A subtotal fat pad excision was performed before the soft tissues were elevated off the anteromedial and anterolateral aspects of the proximal tibia to the level of the collateral ligaments. The anterior portions of the medial and lateral menisci were removed, as was the anterior cruciate ligament. With the knee flexed to 90, the external tibial guide was positioned and the appropriate proximal tibial cut made. This piece was taken to the back table where it was measured and found to be optimally replicated by a 75 mm component.  Attention was directed to the distal femur. The intramedullary canal was accessed through a 3/8" drill hole. The intramedullary guide was inserted and positioned in order to obtain a neutral flexion gap. The intercondylar block was positioned with care taken to avoid notching the anterior cortex of the femur. The appropriate cut was made. Next, the distal cutting block was placed at 6 of valgus alignment. Using the 9 mm slot, the distal cut was made. The distal femur was measured and found to be optimally replicated by the 70 mm component. The 70 mm 4-in-1 cutting block was positioned and first the posterior, then the posterior chamfer, the anterior chamfer, and finally the anterior cuts were made. At this point, the posterior portions medial and lateral menisci were removed. A trial reduction was performed using the appropriate femoral and tibial components with the 10 mm insert. This demonstrated  excellent stability to varus and valgus stressing both in flexion and extension while permitting full extension. Patella tracking was assessed and found to be excellent. Therefore, the tibial guide position was marked on the proximal tibia. The patella thickness was measured and found to be 22 mm. Therefore, the appropriate cut was made. The patellar surface  was measured and found to be optimally replicated by the 34 mm component. The three peg holes were drilled in place before the trial button was inserted. Patella tracking was assessed and found to be excellent, passing the "no thumb test". The lug holes were drilled into the distal femur before the trial component was removed, leaving only the tibial tray. The keel was then created using the appropriate tower, reamer, and punch.  The bony surfaces were prepared for cementing by irrigating them thoroughly with bacitracin saline solution via the jet lavage system. A bone plug was fashioned from some of the bone that had been removed previously and used to plug the distal femoral canal. In addition, 20 cc of Exparel diluted out to 60 cc with normal saline and 30 cc of 0.5% Sensorcaine were injected into the postero-medial and postero-lateral aspects of the knee, the medial and lateral gutter regions, and the peri-incisional tissues to help with postoperative analgesia. Meanwhile, the cement was being mixed on the back table. When it was ready, the tibial tray was cemented in first. The excess cement was removed using [employer] fast streamer. Next, the femoral component was impacted into place. Again, the excess cement was removed using [employer] fast streamer. The 10 mm trial insert was positioned and the knee brought into extension while the cement hardened. Finally, the patella was cemented into place and secured using the patellar clamp. Again, the excess cement was removed using [employer] fast streamer. Once the cement had hardened, the knee was placed through a range of motion with the findings as described above. Therefore, the trial insert was removed and, after verifying that no cement had been retained posteriorly, the permanent 10 mm anterior stabilized E-polyethylene insert was positioned and secured using the appropriate key locking mechanism. Again the knee was placed through a range of motion with the findings as described  above.  The wound was copiously irrigated with sterile saline solution using the jet lavage system before the quadriceps tendon and retinacular layer were reapproximated using #0 Vicryl interrupted sutures. The superficial retinacular layer also was closed using a running #0 Vicryl suture. A total of 10 cc of transexemic acid (TXA) was injected intra-articularly before the subcutaneous tissues were closed in several layers using 2-0 Vicryl interrupted sutures. The skin was closed using staples. A sterile honeycomb dressing was applied to the skin before the leg was wrapped with an Ace wrap to accommodate the Polar Care device. The patient was then awakened and returned to the recovery room in satisfactory condition after tolerating the procedure well.

## 2020-10-11 NOTE — OR Nursing (Signed)
Instructed on use of incentive spirometer with return demonstration. 

## 2020-10-11 NOTE — Anesthesia Postprocedure Evaluation (Signed)
Anesthesia Post Note  Patient: Sherry Vaughan  Procedure(s) Performed: TOTAL KNEE ARTHROPLASTY (Right Knee)  Patient location during evaluation: PACU Anesthesia Type: Spinal Level of consciousness: oriented and awake and alert Pain management: pain level controlled Vital Signs Assessment: post-procedure vital signs reviewed and stable Respiratory status: spontaneous breathing, respiratory function stable and nonlabored ventilation Cardiovascular status: blood pressure returned to baseline and stable Postop Assessment: no headache, no backache and spinal receding Anesthetic complications: no   No complications documented.   Last Vitals:  Vitals:   10/11/20 1141 10/11/20 1235  BP: 110/70 (!) 134/54  Pulse: (!) 58 66  Resp: 16 18  Temp: (!) 36.1 C   SpO2: 100% 100%    Last Pain:  Vitals:   10/11/20 1235  TempSrc:   PainSc: 0-No pain                 Jiovanna Frei

## 2020-10-11 NOTE — OR Nursing (Signed)
Dr Roland Rack assessed scabbed area on left lower leg.  No new orders at this time.

## 2020-10-12 ENCOUNTER — Encounter: Payer: Self-pay | Admitting: Surgery

## 2020-10-13 ENCOUNTER — Encounter: Payer: Self-pay | Admitting: Surgery

## 2020-12-01 ENCOUNTER — Other Ambulatory Visit: Payer: Self-pay | Admitting: Family Medicine

## 2020-12-01 DIAGNOSIS — Z1231 Encounter for screening mammogram for malignant neoplasm of breast: Secondary | ICD-10-CM

## 2020-12-29 ENCOUNTER — Ambulatory Visit
Admission: RE | Admit: 2020-12-29 | Discharge: 2020-12-29 | Disposition: A | Discharge status: Home / Self Care | Payer: Medicare HMO | Source: Ambulatory Visit | Attending: Family Medicine | Admitting: Family Medicine

## 2020-12-29 ENCOUNTER — Other Ambulatory Visit: Payer: Self-pay

## 2020-12-29 DIAGNOSIS — Z1231 Encounter for screening mammogram for malignant neoplasm of breast: Secondary | ICD-10-CM | POA: Diagnosis present

## 2021-11-29 ENCOUNTER — Other Ambulatory Visit: Payer: Self-pay | Admitting: Family Medicine

## 2021-11-29 DIAGNOSIS — Z1231 Encounter for screening mammogram for malignant neoplasm of breast: Secondary | ICD-10-CM

## 2022-01-02 ENCOUNTER — Other Ambulatory Visit: Payer: Self-pay

## 2022-01-02 ENCOUNTER — Ambulatory Visit
Admission: RE | Admit: 2022-01-02 | Discharge: 2022-01-02 | Disposition: A | Discharge status: Home / Self Care | Payer: Medicare HMO | Source: Ambulatory Visit | Attending: Family Medicine | Admitting: Family Medicine

## 2022-01-02 DIAGNOSIS — Z1231 Encounter for screening mammogram for malignant neoplasm of breast: Secondary | ICD-10-CM | POA: Diagnosis not present

## 2022-01-27 ENCOUNTER — Ambulatory Visit
Admission: EM | Admit: 2022-01-27 | Discharge: 2022-01-27 | Disposition: A | Discharge status: Home / Self Care | Payer: Medicare HMO | Attending: Emergency Medicine | Admitting: Emergency Medicine

## 2022-01-27 ENCOUNTER — Other Ambulatory Visit: Payer: Self-pay

## 2022-01-27 DIAGNOSIS — J069 Acute upper respiratory infection, unspecified: Secondary | ICD-10-CM

## 2022-01-27 MED ORDER — PROMETHAZINE-DM 6.25-15 MG/5ML PO SYRP: 5.0000 mL | ORAL_SOLUTION | Freq: Four times a day (QID) | ORAL | 0 refills | Status: DC | PRN

## 2022-01-27 MED ORDER — IPRATROPIUM BROMIDE 0.06 % NA SOLN: 2.0000 | Freq: Four times a day (QID) | NASAL | 12 refills | Status: AC

## 2022-01-27 MED ORDER — BENZONATATE 100 MG PO CAPS: 200.0000 mg | ORAL_CAPSULE | Freq: Three times a day (TID) | ORAL | 0 refills | Status: DC

## 2022-01-27 NOTE — Discharge Instructions (Signed)

## 2022-01-27 NOTE — ED Provider Notes (Signed)
[facility]    CSN: [id number] Arrival date & time: 01/27/22  1531      History   Chief Complaint Chief Complaint  Patient presents with   Cough    HPI Sherry Vaughan is a 76 y.o. female.   HPI  44 old female here for evaluation of respiratory complaints.  Patient reports that she has been experiencing a cough for the last 6 days.  It is intermittent and also is intermittently productive.  Tends to be worse during the day and does not interfere with her sleep at night.  When she does bring up mucus it is pale yellow in color.  This is not associate with shortness of breath, wheezing, runny nose nasal congestion, ear pain, or sore throat.  She states that typically her symptoms of cough start couple hours after she wakes up in the morning.  The times vary depending on what time she gets up that day.  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   Past Surgical History:  Procedure Laterality Date   BROW LIFT Bilateral 12/03/2017   Procedure: BLEPHAROPLASTY;  Surgeon: Karle Starch, MD;  Location: [place];  Service: Ophthalmology;  Laterality: Bilateral;   CATARACT EXTRACTION W/PHACO Right 05/04/2019   Procedure: CATARACT EXTRACTION PHACO AND INTRAOCULAR LENS PLACEMENT (IOC) RIGHT DIABETES DIET CONTROL ONLY;  Surgeon: Eulogio Bear, MD;  Location: [place];  Service: Ophthalmology;  Laterality: Right;   CATARACT EXTRACTION W/PHACO Left 06/01/2019   Procedure: CATARACT EXTRACTION PHACO AND INTRAOCULAR LENS PLACEMENT ([place])   LEFT;  Surgeon: Eulogio Bear, MD;  Location: [place];  Service: Ophthalmology;  Laterality: Left;   COLONOSCOPY     COLONOSCOPY WITH PROPOFOL N/A 03/04/2017   Procedure: COLONOSCOPY WITH PROPOFOL;  Surgeon: Manya Silvas, MD;  Location: [facility] ENDOSCOPY;  Service: Endoscopy;  Laterality: N/A;   ENDOMETRIAL FULGURATION     ESOPHAGOGASTRODUODENOSCOPY     ESOPHAGOGASTRODUODENOSCOPY (EGD) WITH PROPOFOL N/A 03/04/2017   Procedure: ESOPHAGOGASTRODUODENOSCOPY (EGD) WITH PROPOFOL;  Surgeon: Manya Silvas, MD;  Location: [facility] ENDOSCOPY;  Service: Endoscopy;  Laterality: N/A;   KNEE ARTHROSCOPY Left 08/26/2015   Procedure: ARTHROSCOPY KNEE, partial medial meniscectomy and chondral debridement;  Surgeon: Leanor Kail, MD;  Location: [place];  Service: Orthopedics;  Laterality: Left;   TOTAL KNEE ARTHROPLASTY Right 10/11/2020   Procedure: TOTAL KNEE ARTHROPLASTY;  Surgeon: Corky Mull, MD;  Location: [facility];  Service: Orthopedics;  Laterality: Right;    OB History   No obstetric history on file.      Home Medications    Prior to Admission medications   Medication Sig Start Date End Date Taking? Authorizing Provider  benzonatate (TESSALON) 100 MG capsule Take 2 capsules (200 mg total) by mouth every 8 (eight) hours. 01/27/22  Yes Margarette Canada, NP  ipratropium (ATROVENT) 0.06 % nasal spray Place 2 sprays  into both nostrils 4 (four) times daily. 01/27/22  Yes Margarette Canada, NP  promethazine-dextromethorphan (PROMETHAZINE-DM) 6.25-15 MG/5ML syrup Take 5 mLs by mouth 4 (four) times daily as needed. 01/27/22  Yes Margarette Canada, NP  acetaminophen (TYLENOL) 325 MG tablet Take 650 mg by mouth 2 (two) times daily as needed for moderate pain or headache.    [provider]  Ascorbic Acid (VITAMIN C) 1000 MG tablet Take 1,000 mg by mouth daily.    [provider]  Carboxymethylcellul-Glycerin (LUBRICATING EYE DROPS OP) Place 1 drop into both eyes every evening.     [provider]  celecoxib (CELEBREX) 200 MG capsule Take 200 mg by mouth daily. 08/22/21   [provider]  Cholecalciferol (VITAMIN D) 50 MCG (2000 UT) tablet Take 2,000 Units by mouth daily.    [provider]  ELIQUIS 2.5 MG TABS tablet Take 2.5 mg by mouth 2 (two) times daily. 10/07/20   [provider]  Multiple Vitamin (MULTIVITAMIN WITH MINERALS) TABS tablet Take 1 tablet by mouth daily.    [provider]  omeprazole (PRILOSEC) 40 MG capsule Take 40 mg by mouth every morning.     [provider]  oxyCODONE (OXY IR/ROXICODONE) 5 MG immediate release tablet Take 5-10 mg by mouth every 4 (four) hours as needed for pain. 10/07/20   [provider]  Phenylephrine HCl (SINEX REGULAR NA) Place 1 spray into the nose daily as needed (congestion).    [provider]  rosuvastatin (CRESTOR) 5 MG tablet Take 5 mg by mouth at bedtime.     [provider]    Family History Family History  Problem Relation Age of Onset   Heart attack Mother    Heart attack Father    Diabetes Sister    Diabetes Brother    Cancer Brother    Breast cancer Maternal Aunt    Breast cancer Paternal Aunt     Social History Social History   Tobacco Use   Smoking status: Former    Packs/day: 2.00    Years: 20.00    Pack years: 40.00    Types: Cigarettes    Quit date: 12/17/1981    Years since quitting: 40.1   Smokeless tobacco: Never  Vaping Use   Vaping Use: Never used  Substance Use Topics   Alcohol use: Yes    Comment: VERY RARE   Drug use: No     Allergies   Sulfa antibiotics and Zinc   Review of Systems Review of Systems  Constitutional:  Negative for fever.  HENT:  Negative for congestion, ear pain, rhinorrhea and sore throat.   Respiratory:  Positive for cough. Negative for shortness of breath and wheezing.   Gastrointestinal:  Negative for diarrhea, nausea and vomiting.  Skin:  Negative for rash.   Hematological: Negative.   Psychiatric/Behavioral: Negative.      Physical Exam Triage Vital Signs ED Triage Vitals  Enc Vitals Group     BP 01/27/22 1552 (!) 146/59     Pulse Rate 01/27/22 1552 77     Resp 01/27/22 1552 16     Temp 01/27/22 1552 98.6 F (37 C)     Temp Source 01/27/22 1552 Oral     SpO2 01/27/22 1552 99 %     Weight --      Height --      Head Circumference --      Peak Flow --      Pain Score 01/27/22 1550 0  Pain Loc --      Pain Edu? --      Excl. in [place]? --    No data found.  Updated Vital Signs BP (!) 146/59 (BP Location: Left Arm)    Pulse 77    Temp 98.6 F (37 C) (Oral)    Resp 16    SpO2 99%   Visual Acuity Right Eye Distance:   Left Eye Distance:   Bilateral Distance:    Right Eye Near:   Left Eye Near:    Bilateral Near:     Physical Exam Vitals and nursing note reviewed.  Constitutional:      Appearance: Normal appearance. She is not ill-appearing.  HENT:     Head: Normocephalic and atraumatic.     Right Ear: Tympanic membrane, ear canal and external ear normal. There is no impacted cerumen.     Left Ear: Tympanic membrane, ear canal and external ear normal. There is no impacted cerumen.     Nose: Congestion and rhinorrhea present.     Mouth/Throat:     Mouth: Mucous membranes are moist.     Pharynx: Oropharynx is clear. Posterior oropharyngeal erythema present.  Cardiovascular:     Rate and Rhythm: Normal rate and regular rhythm.     Pulses: Normal pulses.     Heart sounds: Normal heart sounds. No murmur heard.   No friction rub. No gallop.  Pulmonary:     Effort: Pulmonary effort is normal.     Breath sounds: Normal breath sounds. No wheezing, rhonchi or rales.  Musculoskeletal:     Cervical back: Normal range of motion and neck supple.  Lymphadenopathy:     Cervical: No cervical adenopathy.  Skin:    General: Skin is warm and dry.     Capillary Refill: Capillary refill takes less than 2 seconds.     Findings: No  erythema or rash.  Neurological:     General: No focal deficit present.     Mental Status: She is alert and oriented to person, place, and time.  Psychiatric:        Mood and Affect: Mood normal.        Behavior: Behavior normal.        Thought Content: Thought content normal.        Judgment: Judgment normal.     UC Treatments / Results  Labs (all labs ordered are listed, but only abnormal results are displayed) Labs Reviewed - No data to display  EKG   Radiology No results found.  Procedures Procedures (including critical care time)  Medications Ordered in UC Medications - No data to display  Initial Impression / Assessment and Plan / UC Course  I have reviewed the triage vital signs and the nursing notes.  Pertinent labs & imaging results that were available during my care of the patient were reviewed by me and considered in my medical decision making (see chart for details).  Patient is a pleasant, nontoxic-appearing 76 year old female here for evaluation of a cough that is been present for last 6 days.  It is intermittently productive and is not present all the time.  Does not tend to interfere with the patient's sleep.  It is not associate with any fever.  On physical exam patient has pearly-gray tympanic membranes bilaterally with normal light reflex and clear external auditory canals.  Nasal mucosa is erythematous and edematous with clear discharge in both nares.  Oropharyngeal exam reveals mild posterior oropharyngeal erythema with clear postnasal drip.  No injection noted.  No cervical of adenopathy appreciated on exam.  Cardiopulmonary exam reveals clear lung sounds in all fields.  Suspect patient's cough is being driven by postnasal drip.  There is no evidence of sinus infection as there is no purulent discharge in either nare.  She has not had a fever.  No evidence of pneumonia as she has not had any fatigue, tachypnea, or decreased appetite.  No tachycardia.  We will  treat her conservatively with Atrovent nasal spray to help with the postnasal drip and Tessalon Perles to help with cough.  We will also give Promethazine DM cough syrup that she can use at bedtime if needed.  Return precautions reviewed with patient.   Final Clinical Impressions(s) / UC Diagnoses   Final diagnoses:  Viral URI with cough     Discharge Instructions      Use the Atrovent nasal spray, 2 squirts in each nostril every 6 hours, as needed for runny nose and postnasal drip.  Use the Tessalon Perles every 8 hours during the day.  Take them with a small sip of water.  They may give you some numbness to the base of your tongue or a metallic taste in your mouth, this is normal.  Use the Promethazine DM cough syrup at bedtime for cough and congestion.  It will make you drowsy so do not take it during the day.  Return for reevaluation or see your primary care provider for any new or worsening symptoms.      ED Prescriptions     Medication Sig Dispense Auth. Provider   ipratropium (ATROVENT) 0.06 % nasal spray Place 2 sprays into both nostrils 4 (four) times daily. 15 mL Margarette Canada, NP   benzonatate (TESSALON) 100 MG capsule Take 2 capsules (200 mg total) by mouth every 8 (eight) hours. 21 capsule Margarette Canada, NP   promethazine-dextromethorphan (PROMETHAZINE-DM) 6.25-15 MG/5ML syrup Take 5 mLs by mouth 4 (four) times daily as needed. 118 mL Margarette Canada, NP      PDMP not reviewed this encounter.   Margarette Canada, NP 01/27/22 1636

## 2022-01-27 NOTE — ED Triage Notes (Signed)
Patient presents to Urgent Care with complaints of a cough since last Sunday. Treating cough with mucinex and cough drops.   Denies fever.

## 2022-06-28 ENCOUNTER — Ambulatory Visit
Admission: RE | Admit: 2022-06-28 | Discharge: 2022-06-28 | Disposition: A | Discharge status: Home / Self Care | Payer: Medicare HMO | Source: Ambulatory Visit | Attending: Emergency Medicine | Admitting: Emergency Medicine

## 2022-06-28 VITALS — BP 139/83 | HR 72 | Temp 98.6°F | Resp 18 | Ht 65.0 in | Wt 227.0 lb

## 2022-06-28 DIAGNOSIS — L600 Ingrowing nail: Secondary | ICD-10-CM | POA: Diagnosis not present

## 2022-06-28 MED ORDER — CEPHALEXIN 500 MG PO CAPS: 500.0000 mg | ORAL_CAPSULE | Freq: Two times a day (BID) | ORAL | 0 refills | Status: AC

## 2022-06-28 NOTE — ED Triage Notes (Signed)
Pt c/o cut on left big toe x4weeks  Pt was cutting her toe nails and cut the edge of her first toe. Pt believes she has a bit of her toe nail stuck in the side of her toe.

## 2022-06-28 NOTE — Discharge Instructions (Signed)
Today your nail has been cut short in the office in hopes that when it begins to grow back it will grow straight and not jagged  Based on your description the area is also infected therefore began Keflex every morning and every evening for the next 5 days  Your wound has been cleansed and a nonstick dressing and compression wrap has been placed, you may remove in the morning  You may cleanse daily with diluted soapy water, pat dry and cover with a nonstick dressing until healed  If you have further concerns about ingrown toenails or your foot you may follow-up with urgent care or podiatry, information is on for your paperwork

## 2022-06-28 NOTE — ED Provider Notes (Signed)
[facility]    CSN: [id number] Arrival date & time: 06/28/22  1449      History   Chief Complaint Chief Complaint  Patient presents with   Toe Injury    HPI Sherry Vaughan is a 76 y.o. female.   Patient presents with erythema, tenderness and swelling to the left great toe for 6 days after pulling part of her toenail .  Has noted drainage to the site.  Endorses that she was clipping the toenails and had an ingrown but does not feel that she removed in its entirety.  Pain is worse at night when she is at rest, cannot be felt during the day when bearing weight.  Has attempted Epsom salt soaks and Tylenol which has been minimally helpful.  Denies fever or chills.  Endorses that she had an appointment with podiatry this past Monday but did not go as she did not want to miss work.  History of diabetes.   Past Medical History:  Diagnosis Date   Arthritis    knees   Cancer ([place])    melanoma/skin ca   Diabetes mellitus without complication ([place])    TYPE 2   Dysphagia    GERD (gastroesophageal reflux disease)    History of colon polyps    Motion sickness    ocean boats   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   Polio    AGE 59   Restless leg syndrome    Restless leg syndrome    Sleep apnea    BORDERLINE PER PT-NO MACHINE    There are no problems to display for this patient.   Past Surgical History:  Procedure Laterality Date   BROW LIFT Bilateral 12/03/2017   Procedure: BLEPHAROPLASTY;  Surgeon: Karle Starch, MD;  Location: [place];  Service: Ophthalmology;  Laterality: Bilateral;   CATARACT EXTRACTION W/PHACO Right 05/04/2019   Procedure: CATARACT EXTRACTION PHACO AND INTRAOCULAR LENS PLACEMENT (IOC) RIGHT DIABETES DIET CONTROL ONLY;  Surgeon: Eulogio Bear, MD;  Location: [place];  Service: Ophthalmology;  Laterality: Right;   CATARACT EXTRACTION W/PHACO Left 06/01/2019   Procedure: CATARACT EXTRACTION PHACO AND INTRAOCULAR LENS PLACEMENT ([place])   LEFT;  Surgeon: Eulogio Bear, MD;  Location: [place];  Service: Ophthalmology;  Laterality: Left;   COLONOSCOPY     COLONOSCOPY WITH PROPOFOL N/A 03/04/2017   Procedure: COLONOSCOPY WITH PROPOFOL;  Surgeon: Manya Silvas, MD;  Location: [facility] ENDOSCOPY;  Service: Endoscopy;  Laterality: N/A;   ENDOMETRIAL FULGURATION     ESOPHAGOGASTRODUODENOSCOPY     ESOPHAGOGASTRODUODENOSCOPY (EGD) WITH PROPOFOL N/A 03/04/2017   Procedure: ESOPHAGOGASTRODUODENOSCOPY (EGD) WITH PROPOFOL;  Surgeon: Manya Silvas, MD;  Location: [facility] ENDOSCOPY;  Service: Endoscopy;  Laterality: N/A;   KNEE ARTHROSCOPY Left 08/26/2015   Procedure: ARTHROSCOPY KNEE, partial medial meniscectomy and chondral debridement;  Surgeon: Leanor Kail, MD;  Location: [place];  Service: Orthopedics;  Laterality: Left;   TOTAL KNEE ARTHROPLASTY Right 10/11/2020   Procedure: TOTAL KNEE ARTHROPLASTY;  Surgeon: Corky Mull, MD;  Location: [facility];  Service: Orthopedics;  Laterality: Right;    OB History   No obstetric history on file.      Home Medications    Prior to Admission medications   Medication Sig Start Date End Date Taking? Authorizing Provider  acetaminophen (TYLENOL) 325 MG tablet Take 650 mg by mouth 2 (two) times daily as needed for moderate pain or headache.   Yes [provider]  Ascorbic Acid (  VITAMIN C) 1000 MG tablet Take 1,000 mg by mouth daily.   Yes [provider]  Carboxymethylcellul-Glycerin (LUBRICATING EYE DROPS OP) Place 1 drop into both eyes every evening.   Yes [provider]  celecoxib (CELEBREX) 200 MG capsule Take 200 mg by mouth daily. 08/22/21  Yes [provider]  Cholecalciferol (VITAMIN D) 50 MCG (2000 UT) tablet Take 2,000 Units by mouth daily.   Yes [provider]  ELIQUIS 2.5 MG TABS tablet Take 2.5 mg by mouth 2 (two) times daily. 10/07/20  Yes [provider]  ipratropium (ATROVENT) 0.06 % nasal spray  Place 2 sprays into both nostrils 4 (four) times daily. 01/27/22  Yes Margarette Canada, NP  Multiple Vitamin (MULTIVITAMIN WITH MINERALS) TABS tablet Take 1 tablet by mouth daily.   Yes [provider]  omeprazole (PRILOSEC) 40 MG capsule Take 40 mg by mouth every morning.    Yes [provider]  oxyCODONE (OXY IR/ROXICODONE) 5 MG immediate release tablet Take 5-10 mg by mouth every 4 (four) hours as needed for pain. 10/07/20  Yes [provider]  Phenylephrine HCl (SINEX REGULAR NA) Place 1 spray into the nose daily as needed (congestion).   Yes [provider]  rosuvastatin (CRESTOR) 5 MG tablet Take 5 mg by mouth at bedtime.    Yes [provider]  benzonatate (TESSALON) 100 MG capsule Take 2 capsules (200 mg total) by mouth every 8 (eight) hours. 01/27/22   Margarette Canada, NP  promethazine-dextromethorphan (PROMETHAZINE-DM) 6.25-15 MG/5ML syrup Take 5 mLs by mouth 4 (four) times daily as needed. 01/27/22   Margarette Canada, NP    Family History Family History  Problem Relation Age of Onset   Heart attack Mother    Heart attack Father    Diabetes Sister    Diabetes Brother    Cancer Brother    Breast cancer Maternal Aunt    Breast cancer Paternal Aunt     Social History Social History   Tobacco Use   Smoking status: Former    Packs/day: 2.00    Years: 20.00    Total pack years: 40.00    Types: Cigarettes    Quit date: 12/17/1981    Years since quitting: 40.5   Smokeless tobacco: Never  Vaping Use   Vaping Use: Never used  Substance Use Topics   Alcohol use: Yes    Comment: VERY RARE   Drug use: No     Allergies   Sulfa antibiotics and Zinc   Review of Systems Review of Systems  Constitutional: Negative.   Respiratory: Negative.    Cardiovascular: Negative.   Skin:  Positive for wound. Negative for color change, pallor and rash.     Physical Exam Triage Vital Signs ED Triage Vitals  Enc Vitals Group     BP 06/28/22 1515  139/83     Pulse Rate 06/28/22 1515 72     Resp 06/28/22 1515 18     Temp 06/28/22 1515 98.6 F (37 C)     Temp Source 06/28/22 1515 Oral     SpO2 06/28/22 1515 100 %     Weight 06/28/22 1513 227 lb (103 kg)     Height 06/28/22 1513 '5\' 5"'$  (1.651 m)     Head Circumference --      Peak Flow --      Pain Score 06/28/22 1513 4     Pain Loc --      Pain Edu? --  Excl. in [place]? --    No data found.  Updated Vital Signs BP 139/83 (BP Location: Left Arm)   Pulse 72   Temp 98.6 F (37 C) (Oral)   Resp 18   Ht '5\' 5"'$  (1.651 m)   Wt 227 lb (103 kg)   SpO2 100%   BMI 37.77 kg/m   Visual Acuity Right Eye Distance:   Left Eye Distance:   Bilateral Distance:    Right Eye Near:   Left Eye Near:    Bilateral Near:     Physical Exam Constitutional:      Appearance: Normal appearance.  HENT:     Head: Normocephalic.  Eyes:     Extraocular Movements: Extraocular movements intact.  Pulmonary:     Effort: Pulmonary effort is normal.  Feet:     Comments: , Partial ingrown noted to the medial aspect of the left great toe, yellow drainage with dried blood noted to the medial aspect of the nailbed of the left great toe, erythema surrounding the medial and proximal nailbed with associated tenderness, able to bear weight, range of motion intact, sensation intact, capillary refill less than 3 Neurological:     Mental Status: She is alert and oriented to person, place, and time. Mental status is at baseline.  Psychiatric:        Mood and Affect: Mood normal.        Behavior: Behavior normal.      UC Treatments / Results  Labs (all labs ordered are listed, but only abnormal results are displayed) Labs Reviewed - No data to display  EKG   Radiology No results found.  Procedures Excise Ingrown Toenail  Date/Time: 06/28/2022 4:04 PM  Performed by: Hans Eden, NP Authorized by: Hans Eden, NP   Consent:    Consent obtained:  Verbal   Consent given by:   Patient   Risks, benefits, and alternatives were discussed: yes     Risks discussed:  Incomplete removal and pain Universal protocol:    Procedure explained and questions answered to patient or proxy's satisfaction: yes     Patient identity confirmed:  Verbally with patient Location:    Foot:  L big toe Pre-procedure details:    Skin preparation:  Chlorhexidine   Preparation: Patient was prepped and draped in the usual sterile fashion   Anesthesia:    Anesthesia method:  Local infiltration   Local anesthetic:  Lidocaine 1% w/o epi Ingrown nail:    Wedge excision of skin: yes     Nail matrix removed or ablated:  Complete Post-procedure details:    Dressing: Nonadhesive dressing.   Procedure completion:  Tolerated  (including critical care time)  Medications Ordered in UC Medications - No data to display  Initial Impression / Assessment and Plan / UC Course  I have reviewed the triage vital signs and the nursing notes.  Pertinent labs & imaging results that were available during my care of the patient were reviewed by me and considered in my medical decision making (see chart for details).  Ingrown toenail with infection  Ingrown toenail removed, site cleansed with chlorhexidine, and a nonadherent dressing with compression wrap placed, to be left in place until morning, then may cleanse daily with diluted soapy water, pat dry and cover with Band-Aid until healed, Keflex 5-day course prescribed to prevent infection, may use over-the-counter analgesics for management of pain, given walker referral to podiatry for reevaluation if symptoms persist or worsen Final Clinical Impressions(s) / UC  Diagnoses   Final diagnoses:  None   Discharge Instructions   None    ED Prescriptions   None    PDMP not reviewed this encounter.   Hans Eden, NP 06/28/22 1606

## 2022-11-26 ENCOUNTER — Other Ambulatory Visit: Payer: Self-pay | Admitting: Family Medicine

## 2022-11-26 DIAGNOSIS — Z1231 Encounter for screening mammogram for malignant neoplasm of breast: Secondary | ICD-10-CM

## 2023-01-08 ENCOUNTER — Ambulatory Visit
Admission: RE | Admit: 2023-01-08 | Discharge: 2023-01-08 | Disposition: A | Discharge status: Home / Self Care | Payer: Medicare HMO | Source: Ambulatory Visit | Attending: Family Medicine | Admitting: Family Medicine

## 2023-01-08 DIAGNOSIS — Z1231 Encounter for screening mammogram for malignant neoplasm of breast: Secondary | ICD-10-CM | POA: Insufficient documentation

## 2023-01-09 ENCOUNTER — Other Ambulatory Visit: Payer: Self-pay | Admitting: Nurse Practitioner

## 2023-01-09 DIAGNOSIS — R1310 Dysphagia, unspecified: Secondary | ICD-10-CM

## 2023-01-17 ENCOUNTER — Ambulatory Visit
Admission: RE | Admit: 2023-01-17 | Discharge: 2023-01-17 | Disposition: A | Discharge status: Home / Self Care | Payer: Medicare HMO | Source: Ambulatory Visit | Attending: Nurse Practitioner | Admitting: Nurse Practitioner

## 2023-01-17 DIAGNOSIS — R1310 Dysphagia, unspecified: Secondary | ICD-10-CM | POA: Diagnosis not present

## 2023-01-17 NOTE — Therapy (Addendum)
[place], [place], [address] Phone: [phone]   Fax:     Modified Barium Swallow  Patient Details  Name: Sherry Vaughan MRN: [id number] Date of Birth: September 15, 1946 No data recorded  Encounter Date: 01/17/2023   End of Session - 01/17/23 1432     Visit Number 1    Number of Visits 1    Date for SLP Re-Evaluation 01/17/23    SLP Start Time 1250    SLP Stop Time  1400    SLP Time Calculation (min) 70 min    Activity Tolerance Patient tolerated treatment well             Past Medical History:  Diagnosis Date   Arthritis    knees   Cancer ([place])    melanoma/skin ca   Diabetes mellitus without complication ([place])    TYPE 2   Dysphagia    GERD (gastroesophageal reflux disease)    History of colon polyps    Motion sickness    ocean boats   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   Polio    AGE 77   Restless leg syndrome    Restless leg syndrome    Sleep apnea    BORDERLINE PER PT-NO MACHINE    Past Surgical History:  Procedure Laterality Date   BROW LIFT Bilateral 12/03/2017   Procedure: BLEPHAROPLASTY;  Surgeon: Karle Starch, MD;  Location: [place];  Service: Ophthalmology;  Laterality: Bilateral;   CATARACT EXTRACTION W/PHACO Right 05/04/2019   Procedure: CATARACT EXTRACTION PHACO AND INTRAOCULAR LENS PLACEMENT (IOC) RIGHT DIABETES DIET CONTROL ONLY;  Surgeon: Eulogio Bear, MD;  Location: [place];  Service: Ophthalmology;  Laterality: Right;   CATARACT EXTRACTION W/PHACO Left 06/01/2019   Procedure: CATARACT EXTRACTION PHACO AND INTRAOCULAR LENS PLACEMENT ([place])  LEFT;  Surgeon: Eulogio Bear, MD;  Location: [place];  Service: Ophthalmology;  Laterality: Left;   COLONOSCOPY     COLONOSCOPY WITH PROPOFOL N/A 03/04/2017   Procedure: COLONOSCOPY WITH PROPOFOL;  Surgeon: Manya Silvas, MD;  Location: [facility] ENDOSCOPY;  Service: Endoscopy;  Laterality: N/A;    ENDOMETRIAL FULGURATION     ESOPHAGOGASTRODUODENOSCOPY     ESOPHAGOGASTRODUODENOSCOPY (EGD) WITH PROPOFOL N/A 03/04/2017   Procedure: ESOPHAGOGASTRODUODENOSCOPY (EGD) WITH PROPOFOL;  Surgeon: Manya Silvas, MD;  Location: [facility] ENDOSCOPY;  Service: Endoscopy;  Laterality: N/A;   KNEE ARTHROSCOPY Left 08/26/2015   Procedure: ARTHROSCOPY KNEE, partial medial meniscectomy and chondral debridement;  Surgeon: Leanor Kail, MD;  Location: [place];  Service: Orthopedics;  Laterality: Left;   TOTAL KNEE ARTHROPLASTY Right 10/11/2020   Procedure: TOTAL KNEE ARTHROPLASTY;  Surgeon: Corky Mull, MD;  Location: [facility] ORS;  Service: Orthopedics;  Laterality: Right;    There were no vitals filed for this visit.  Subjective: Patient behavior: (alertness, ability to follow instructions, etc.): A/Ox4. She described intermittent issues of "coughing" while on my tablet/TV and intermittently during the day outside of oral intake. She denied any swallowing issues; no complaints except w/ eating particulate foods "sometimes". No recent weight loss. She did endorse Dry Mouth, Xerostomia. She described strategies she was already using to address it including Biotene and increased water intake during the day. Noted frequent dry, throat clearing during conversation pending initiation of exam. Pt stated she was unaware she did that.  OF NOTE: pt endorsed s/s of REFLUX including increased phlegm in the morning, awaking during the night w/ discomfort of indigestion  having to take something for it, chronic, dry cough/throat clearing during the day. Pt already sleeps w/ her bed inclined and is on a PPI 1x daily. Pt reported Esophageal Dilation ~10 years ago. She is scheduled to see her GI in 03/2023.  NO h/o pneumonia or neurological events.  Chief complaint: dysphagia  OM Exam: WFL w/ no unilateral weakness; Cough strong.  Native Dentition.    Objective:  Radiological Procedure: A videoflouroscopic  evaluation of oral-preparatory, reflex initiation, and pharyngeal phases of the swallow was performed; as well as a screening of the upper esophageal phase.  POSTURE: VIEW: COMPENSATORY STRATEGIES: BOLUSES ADMINISTERED:  Thin Liquid: x2 tsps, 2 cup sip, 3 sequential cup sips  Nectar-thick Liquid: x1 tsp, 1 cup sip, 2 sequential cup sips  Honey-thick Liquid: NT  Puree: 1 tsp  Mechanical Soft: 1 (1/2 graham cracker with pudding) RESULTS OF EVALUATION: ORAL PREPARATORY PHASE: (The lips, tongue, and velum are observed for strength and coordination)       **Overall Severity Rating: WFL.  SWALLOW INITIATION/REFLEX: (The reflex is normal if "triggered" by the time the bolus reached the base of the tongue)  **Overall Severity Rating: grossly WFL for age.  PHARYNGEAL PHASE: (Pharyngeal function is normal if the bolus shows rapid, smooth, and continuous transit through the pharynx and there is no pharyngeal residue after the swallow)  **Overall Severity Rating: [facility].  LARYNGEAL PENETRATION: (Material entering into the laryngeal inlet/vestibule but not aspirated): trace amount of coating along the underneath side of the epiglottis x1 during FIRST tsp trial -- none noted thereafter.  ASPIRATION: NONE ESOPHAGEAL PHASE: (Screening of the upper esophagus): apparent anterior and posterior wall prominences, prominent cricopharyngeus muscle. Monitor for beginnings of a CP Bar.    ASSESSMENT: Patient presents with grossly functional oropharyngeal swallowing for age. Oral stage is characterized by adequate lip closure, bolus preparation and containment, and anterior to posterior transit. Slight bolus loss of thin liquids to the valleculae during prep of A-P transfer noted x1 during TSP presentation on FIRST trial given -- nothing noted thereafter. Swallow initiation occurs primarily at the level of the valleculae w/ slight spillage to the pyriform sinuses. Pharyngeal stage is noted for adequate tongue base  retraction, adequate hyolaryngeal excursion, and adequate pharyngeal constriction. Adequate clearing of bolus material w/in the pharynx. Epiglottic deflection is complete; there is no consistent penetration nor aspiration. There is slight-min valleculae and intermittently pyriform sinus residue noted intermittently w/ bolus trials, which cleared by dry swallow b/t trials. Pharyngeal stripping wave is complete. Amplitude/duration of cricopharyngeus opening is WFL.  There is complete clearance of bolus trials through the Cervical Esophagus. OF NOTE: apparent anterior and posterior wall prominences noted, prominent cricopharyngeus muscle. Monitor for any c/o globus in the thyroid notch area and/or beginnings of a CP Bar in the future.  Pt intermittently exhibited throat clearing which was Not immediate to the bolus trials given. This was noted during conversation PRIOR TO any po's being given. Encouraged pt to f/u w/ GI/MD re: her REFLUX in setting of her c/o phlegm, dry mouth, throat clearing, and episodes of indigestion which can all be the impact of REFLUX.   PLAN/RECOMMENDATIONS:  A. Diet: Regular diet w/ moistened foods(avoid problematic foods); Thin liquids. Pills w/ a puree for ease of swallowing if desired.  B. Swallowing Precautions: general aspiration and REFLUX precautions - reduce talking and distractions during meals.  C. Recommended consultation to: f/u w/ GI re: assessment and management of REFLUX - pt has a f/u w/ GI pending in  03/2023 she said.  D. Therapy recommendations: NONE  E. Results and recommendations were discussed w/ patient post evaluation. Education provided on results and recommendations from exam, general aspiration and Reflux precautions, food consistencies/particulate foods, food options/prep, oral hygiene and strategies to moisten mouth, recommendation to f/u w/ GI as scheduled(per pt).        Dysphagia, unspecified type - Plan: DG SWALLOW FUNC OP MEDICARE SPEECH PATH,  DG SWALLOW FUNC OP MEDICARE SPEECH PATH  Problem List There are no problems to display for this patient.         Orinda Kenner, MS, CCC-SLP Speech Language Pathologist Rehab Services; [place] [phone] ([address]) [place], CCC-SLP 01/17/2023, 2:33 PM  [facility] [place], [place], [address] Phone: [phone]   Fax:     Name: Sherry Vaughan MRN: [id number] Date of Birth: [birth year]

## 2023-03-29 ENCOUNTER — Encounter: Payer: Self-pay | Admitting: *Deleted

## 2023-04-01 ENCOUNTER — Ambulatory Visit: Payer: Medicare HMO | Admitting: Anesthesiology

## 2023-04-01 ENCOUNTER — Ambulatory Visit
Admission: RE | Admit: 2023-04-01 | Discharge: 2023-04-01 | Disposition: A | Discharge status: Home / Self Care | Payer: Medicare HMO | Source: Ambulatory Visit | Attending: Gastroenterology | Admitting: Gastroenterology

## 2023-04-01 ENCOUNTER — Encounter
Admission: RE | Disposition: A | Discharge status: Home / Self Care | Payer: Self-pay | Source: Ambulatory Visit | Attending: Gastroenterology

## 2023-04-01 DIAGNOSIS — K219 Gastro-esophageal reflux disease without esophagitis: Secondary | ICD-10-CM | POA: Insufficient documentation

## 2023-04-01 DIAGNOSIS — D122 Benign neoplasm of ascending colon: Secondary | ICD-10-CM | POA: Diagnosis not present

## 2023-04-01 DIAGNOSIS — Z1211 Encounter for screening for malignant neoplasm of colon: Secondary | ICD-10-CM | POA: Insufficient documentation

## 2023-04-01 DIAGNOSIS — R1310 Dysphagia, unspecified: Secondary | ICD-10-CM | POA: Diagnosis not present

## 2023-04-01 DIAGNOSIS — Z86010 Personal history of colonic polyps: Secondary | ICD-10-CM | POA: Diagnosis not present

## 2023-04-01 DIAGNOSIS — Q399 Congenital malformation of esophagus, unspecified: Secondary | ICD-10-CM | POA: Insufficient documentation

## 2023-04-01 DIAGNOSIS — G2581 Restless legs syndrome: Secondary | ICD-10-CM | POA: Diagnosis not present

## 2023-04-01 DIAGNOSIS — K640 First degree hemorrhoids: Secondary | ICD-10-CM | POA: Diagnosis not present

## 2023-04-01 DIAGNOSIS — K648 Other hemorrhoids: Secondary | ICD-10-CM | POA: Diagnosis not present

## 2023-04-01 DIAGNOSIS — E669 Obesity, unspecified: Secondary | ICD-10-CM | POA: Insufficient documentation

## 2023-04-01 DIAGNOSIS — G4730 Sleep apnea, unspecified: Secondary | ICD-10-CM | POA: Insufficient documentation

## 2023-04-01 DIAGNOSIS — Z87891 Personal history of nicotine dependence: Secondary | ICD-10-CM | POA: Insufficient documentation

## 2023-04-01 DIAGNOSIS — Z09 Encounter for follow-up examination after completed treatment for conditions other than malignant neoplasm: Secondary | ICD-10-CM | POA: Insufficient documentation

## 2023-04-01 DIAGNOSIS — E119 Type 2 diabetes mellitus without complications: Secondary | ICD-10-CM | POA: Diagnosis not present

## 2023-04-01 DIAGNOSIS — Z85820 Personal history of malignant melanoma of skin: Secondary | ICD-10-CM | POA: Diagnosis not present

## 2023-04-01 DIAGNOSIS — Z08 Encounter for follow-up examination after completed treatment for malignant neoplasm: Secondary | ICD-10-CM | POA: Insufficient documentation

## 2023-04-01 HISTORY — PX: COLONOSCOPY WITH PROPOFOL: SHX5780

## 2023-04-01 HISTORY — PX: ESOPHAGOGASTRODUODENOSCOPY: SHX5428

## 2023-04-01 SURGERY — COLONOSCOPY WITH PROPOFOL
Anesthesia: General

## 2023-04-01 MED ORDER — PROPOFOL 10 MG/ML IV BOLUS
INTRAVENOUS | Status: DC | PRN
  Administered 2023-04-01: 60 mg via INTRAVENOUS

## 2023-04-01 MED ORDER — PROPOFOL 1000 MG/100ML IV EMUL
INTRAVENOUS | Status: AC
  Filled 2023-04-01: qty 100

## 2023-04-01 MED ORDER — LIDOCAINE HCL (CARDIAC) PF 100 MG/5ML IV SOSY
PREFILLED_SYRINGE | INTRAVENOUS | Status: DC | PRN
  Administered 2023-04-01: 30 mg via INTRAVENOUS

## 2023-04-01 MED ORDER — SODIUM CHLORIDE 0.9 % IV SOLN: INTRAVENOUS | Status: DC

## 2023-04-01 MED ORDER — GLYCOPYRROLATE 0.2 MG/ML IJ SOLN
INTRAMUSCULAR | Status: AC
  Filled 2023-04-01: qty 1

## 2023-04-01 MED ORDER — LIDOCAINE HCL (PF) 2 % IJ SOLN
INTRAMUSCULAR | Status: AC
  Filled 2023-04-01: qty 5

## 2023-04-01 MED ORDER — PROPOFOL 500 MG/50ML IV EMUL
INTRAVENOUS | Status: DC | PRN
  Administered 2023-04-01: 125 ug/kg/min via INTRAVENOUS

## 2023-04-01 MED ORDER — GLYCOPYRROLATE 0.2 MG/ML IJ SOLN
INTRAMUSCULAR | Status: DC | PRN
  Administered 2023-04-01: .2 mg via INTRAVENOUS

## 2023-04-01 NOTE — Op Note (Signed)
[facility] Gastroenterology Patient Name: Sherry Vaughan Procedure Date: 04/01/2023 10:01 AM MRN: [id number] Account #: [id number] Date of Birth: 03-03-1946 Admit Type: Outpatient Age: 77 Room: 3 Gender: Female Note Status: Finalized Instrument Name: Upper Endoscope [id number] Procedure:             Upper GI endoscopy Indications:           Dysphagia Providers:             Eather Colas MD, MD Medicines:             Monitored Anesthesia Care Complications:         No immediate complications. Procedure:             Pre-Anesthesia Assessment:                        - Prior to the procedure, a History and Physical was                         performed, and patient medications and allergies were                         reviewed. The patient is competent. The risks and                         benefits of the procedure and the sedation options and                         risks were discussed with the patient. All questions                         were answered and informed consent was obtained.                         Patient identification and proposed procedure were                         verified by the physician, the nurse, the                         anesthesiologist, the anesthetist and the technician                         in the endoscopy suite. Mental Status Examination:                         alert and oriented. Airway Examination: normal                         oropharyngeal airway and neck mobility. Respiratory                         Examination: clear to auscultation. CV Examination:                         normal. Prophylactic Antibiotics: The patient does not                         require prophylactic antibiotics. Prior  Anticoagulants: The patient has taken no anticoagulant                         or antiplatelet agents except for aspirin. ASA Grade                         Assessment: III - A patient with severe systemic                          disease. After reviewing the risks and benefits, the                         patient was deemed in satisfactory condition to                         undergo the procedure. The anesthesia plan was to use                         monitored anesthesia care (MAC). Immediately prior to                         administration of medications, the patient was                         re-assessed for adequacy to receive sedatives. The                         heart rate, respiratory rate, oxygen saturations,                         blood pressure, adequacy of pulmonary ventilation, and                         response to care were monitored throughout the                         procedure. The physical status of the patient was                         re-assessed after the procedure.                        After obtaining informed consent, the endoscope was                         passed under direct vision. Throughout the procedure,                         the patient's blood pressure, pulse, and oxygen                         saturations were monitored continuously. The                         Endosonoscope was introduced through the mouth, and                         advanced to the second part of duodenum. The upper GI  endoscopy was accomplished without difficulty. The                         patient tolerated the procedure well. Findings:      The examined esophagus was mildly tortuous.      The entire examined stomach was normal.      The examined duodenum was normal. Impression:            - Tortuous esophagus.                        - Normal stomach.                        - Normal examined duodenum.                        - No specimens collected. Recommendation:        - Discharge patient to home.                        - Resume previous diet.                        - Continue present medications.                        - Return to referring  physician as previously                         scheduled. Procedure Code(s):     --- Professional ---                        [phone], Esophagogastroduodenoscopy, flexible,                         transoral; diagnostic, including collection of                         specimen(s) by brushing or washing, when performed                         (separate procedure) Diagnosis Code(s):     --- Professional ---                        Q39.9, Congenital malformation of esophagus,                         unspecified                        R13.10, Dysphagia, unspecified CPT copyright 2022 American Medical Association. All rights reserved. The codes documented in this report are preliminary and upon coder review may  be revised to meet current compliance requirements. Eather Colas MD, MD 04/01/2023 10:32:06 AM Number of Addenda: 0 Note Initiated On: 04/01/2023 10:01 AM Estimated Blood Loss:  Estimated blood loss: none.      [facility]

## 2023-04-01 NOTE — Op Note (Signed)
[facility] Gastroenterology Patient Name: Sherry Vaughan Procedure Date: 04/01/2023 9:53 AM MRN: [id number] Account #: [id number] Date of Birth: 1946-11-17 Admit Type: Outpatient Age: 77 Room: [facility] ENDO ROOM 3 Gender: Female Note Status: Finalized Instrument Name: Nelda [place]se[place] [id number] Procedure:             Colonoscopy Indications:           High risk colon cancer surveillance: Personal history                         of colonic polyps, Last colonoscopy 5 years ago Providers:             Eather Colas MD, MD Referring MD:          Marina Goodell (Referring MD) Medicines:             Monitored Anesthesia Care Complications:         No immediate complications. Procedure:             Pre-Anesthesia Assessment:                        - Prior to the procedure, a History and Physical was                         performed, and patient medications and allergies were                         reviewed. The patient is competent. The risks and                         benefits of the procedure and the sedation options and                         risks were discussed with the patient. All questions                         were answered and informed consent was obtained.                         Patient identification and proposed procedure were                         verified by the physician, the nurse, the                         anesthesiologist, the anesthetist and the technician                         in the endoscopy suite. Mental Status Examination:                         alert and oriented. Airway Examination: normal                         oropharyngeal airway and neck mobility. Respiratory                         Examination: clear to auscultation. CV Examination:  normal. Prophylactic Antibiotics: The patient does not                         require prophylactic antibiotics. Prior                         Anticoagulants: The patient has  taken no anticoagulant                         or antiplatelet agents except for aspirin. ASA Grade                         Assessment: III - A patient with severe systemic                         disease. After reviewing the risks and benefits, the                         patient was deemed in satisfactory condition to                         undergo the procedure. The anesthesia plan was to use                         monitored anesthesia care (MAC). Immediately prior to                         administration of medications, the patient was                         re-assessed for adequacy to receive sedatives. The                         heart rate, respiratory rate, oxygen saturations,                         blood pressure, adequacy of pulmonary ventilation, and                         response to care were monitored throughout the                         procedure. The physical status of the patient was                         re-assessed after the procedure.                        After obtaining informed consent, the colonoscope was                         passed under direct vision. Throughout the procedure,                         the patient's blood pressure, pulse, and oxygen                         saturations were monitored continuously. The  Colonoscope was introduced through the anus and                         advanced to the the cecum, identified by appendiceal                         orifice and ileocecal valve. The colonoscopy was                         performed without difficulty. The patient tolerated                         the procedure well. The quality of the bowel                         preparation was good. The ileocecal valve, appendiceal                         orifice, and rectum were photographed. Findings:      The perianal and digital rectal examinations were normal.      A 2 mm polyp was found in the ascending colon. The polyp was  sessile.       The polyp was removed with a jumbo cold forceps. Resection and retrieval       were complete. Estimated blood loss was minimal.      Internal hemorrhoids were found during retroflexion. The hemorrhoids       were Grade I (internal hemorrhoids that do not prolapse).      Anal papilla(e) were hypertrophied.      The exam was otherwise without abnormality on direct and retroflexion       views. Impression:            - One 2 mm polyp in the ascending colon, removed with                         a jumbo cold forceps. Resected and retrieved.                        - Internal hemorrhoids.                        - Anal papilla(e) were hypertrophied.                        - The examination was otherwise normal on direct and                         retroflexion views. Recommendation:        - Discharge patient to home.                        - Resume previous diet.                        - Continue present medications.                        - Await pathology results.                        -  Repeat colonoscopy is not recommended due to current                         age (68 years or older) for surveillance.                        - Return to referring physician as previously                         scheduled. Procedure Code(s):     --- Professional ---                        [phone], Colonoscopy, flexible; with biopsy, single or                         multiple Diagnosis Code(s):     --- Professional ---                        Z86.010, Personal history of colonic polyps                        D12.2, Benign neoplasm of ascending colon                        K62.89, Other specified diseases of anus and rectum                        K64.0, First degree hemorrhoids CPT copyright 2022 American Medical Association. All rights reserved. The codes documented in this report are preliminary and upon coder review may  be revised to meet current compliance requirements. Eather Colas  MD, MD 04/01/2023 10:37:12 AM Number of Addenda: 0 Note Initiated On: 04/01/2023 9:53 AM Scope Withdrawal Time: 0 hours 5 minutes 45 seconds  Total Procedure Duration: 0 hours 11 minutes 2 seconds  Estimated Blood Loss:  Estimated blood loss was minimal.      [facility]

## 2023-04-01 NOTE — Anesthesia Preprocedure Evaluation (Signed)
Anesthesia Evaluation  Patient identified by MRN, date of birth, ID band Patient awake    Reviewed: Allergy & Precautions, NPO status , Patient's Chart, lab work & pertinent test results  History of Anesthesia Complications Negative for: history of anesthetic complications  Airway Mallampati: III  TM Distance: >3 FB Neck ROM: full    Dental  (+) Chipped   Pulmonary sleep apnea , former smoker Chronic cough    Pulmonary exam normal        Cardiovascular negative cardio ROS Normal cardiovascular exam     Neuro/Psych negative neurological ROS  negative psych ROS   GI/Hepatic Neg liver ROS,GERD  Medicated,,dysphagia   Endo/Other  negative endocrine ROSdiabetes, Type 2    Renal/GU negative Renal ROS  negative genitourinary   Musculoskeletal  (+) Arthritis ,    Abdominal   Peds  Hematology negative hematology ROS (+)   Anesthesia Other Findings Past Medical History: No date: Arthritis     Comment:  knees No date: Cancer     Comment:  melanoma/skin ca No date: Diabetes mellitus without complication     Comment:  TYPE 2 No date: Dysphagia No date: GERD (gastroesophageal reflux disease) No date: History of colon polyps No date: Motion sickness     Comment:  ocean boats No date: 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 No date: Polio     Comment:  AGE 77 No date: Restless leg syndrome No date: Restless leg syndrome No date: Sleep apnea     Comment:  BORDERLINE PER PT-NO MACHINE  Past Surgical History: 12/03/2017: BROW LIFT; Bilateral     Comment:  Procedure: BLEPHAROPLASTY;  Surgeon: Imagene Riches, MD;               Location: [facility] SUR[facility]ERY [facility];  Service: Ophthalmology;                Laterality: Bilateral; 05/04/2019: CATARACT EXTRACTION W/PHACO; Right     Comment:  Procedure: CATARACT EXTRACTION PHACO AND INTRAOCULAR               LENS PLACEMENT (IOC) RIGHT DIABETES DIET CONTROL ONLY;                Surgeon:  [place] Crane, MD;  Location: [facility]               SURGERY CNTR;  Service: Ophthalmology;  Laterality:               Right; 06/01/2019: CATARACT EXTRACTION W/PHACO; Left     Comment:  Procedure: CATARACT EXTRACTION PHACO AND INTRAOCULAR               LENS PLACEMENT (IOC)  LEFT;  Surgeon: [place] Crane,              MD;  Location: [facility]ERY [facility];  Service:               Ophthalmology;  Laterality: Left; No date: COLONOSCOPY 03/04/2017: COLONOSCOPY WITH PROPOFOL; N/A     Comment:  Procedure: COLONOSCOPY WITH PROPOFOL;  Surgeon: Scot Jun, MD;  Location: [facility] ENDOSCOPY;  Service:               Endoscopy;  Laterality: N/A; No date: ENDOMETRIAL FULGURATION No date: ESOPHAGOGASTRODUODENOSCOPY 03/04/2017: ESOPHAGOGASTRODUODENOSCOPY (EGD) WITH PROPOFOL; N/A     Comment:  Procedure: ESOPHAGOGASTRODUODENOSCOPY (EGD) WITH  PROPOFOL;  Surgeon: Scot Jun, MD;  Location: [facility]              ENDOSCOPY;  Service: Endoscopy;  Laterality: N/A; No date: EYE SURGERY No date: JOINT REPLACEMENT 08/26/2015: KNEE ARTHROSCOPY; Left     Comment:  Procedure: ARTHROSCOPY KNEE, partial medial meniscectomy              and chondral debridement;  Surgeon: Erin Sons, MD;               Location: [facility] SURGERY CNTR;  Service: Orthopedics;                Laterality: Left; 10/11/2020: TOTAL KNEE ARTHROPLASTY; Right     Comment:  Procedure: TOTAL KNEE ARTHROPLASTY;  Surgeon: Christena Flake, MD;  Location: [facility] ORS;  Service: Orthopedics;                Laterality: Right;     Reproductive/Obstetrics negative OB ROS                             Anesthesia Physical Anesthesia Plan  ASA: 3  Anesthesia Plan: General   Post-op Pain Management: Minimal or no pain anticipated   Induction: Intravenous  PONV Risk Score and Plan: Propofol infusion and TIVA  Airway Management Planned: Natural Airway and Nasal  Cannula  Additional Equipment:   Intra-op Plan:   Post-operative Plan:   Informed Consent: I have reviewed the patients History and Physical, chart, labs and discussed the procedure including the risks, benefits and alternatives for the proposed anesthesia with the patient or authorized representative who has indicated his/her understanding and acceptance.     Dental Advisory Given  Plan Discussed with: Anesthesiologist, CRNA and Surgeon  Anesthesia Plan Comments: (Patient consented for risks of anesthesia including but not limited to:  - adverse reactions to medications - risk of airway placement if required - damage to eyes, teeth, lips or other oral mucosa - nerve damage due to positioning  - sore throat or hoarseness - Damage to heart, brain, nerves, lungs, other parts of body or loss of life  Patient voiced understanding.)       Anesthesia Quick Evaluation

## 2023-04-01 NOTE — H&P (Signed)
Outpatient short stay form Pre-procedure 04/01/2023  Regis Bill, MD  Primary Physician: Marina Goodell, MD  Reason for visit:  Dysphagia/Personal History of polyps  History of present illness:    77 y/o lady with history of GERD/Dysphagia and obesity. No blood thinners. No first degree relatives with GI malignancies. No significant abdominal surgeries. Last colonoscopy in 2018 was unremarkable.    Current Facility-Administered Medications:    0.9 %  sodium chloride infusion, , Intravenous, Continuous, Paisleigh Maroney, Rossie Muskrat, MD, Last Rate: 20 mL/hr at 04/01/23 0958, Continued from Pre-op at 04/01/23 0958  Medications Prior to Admission  Medication Sig Dispense Refill Last Dose   acetaminophen (TYLENOL) 325 MG tablet Take 650 mg by mouth 2 (two) times daily as needed for moderate pain or headache.      Ascorbic Acid (VITAMIN C) 1000 MG tablet Take 1,000 mg by mouth daily.      Carboxymethylcellul-Glycerin (LUBRICATING EYE DROPS OP) Place 1 drop into both eyes every evening.      celecoxib (CELEBREX) 200 MG capsule Take 200 mg by mouth daily.      Cholecalciferol (VITAMIN D) 50 MCG (2000 UT) tablet Take 2,000 Units by mouth daily.      ELIQUIS 2.5 MG TABS tablet Take 2.5 mg by mouth 2 (two) times daily. (Patient not taking: Reported on 03/27/2023)   Not Taking   ipratropium (ATROVENT) 0.06 % nasal spray Place 2 sprays into both nostrils 4 (four) times daily. 15 mL 12    Multiple Vitamin (MULTIVITAMIN WITH MINERALS) TABS tablet Take 1 tablet by mouth daily.      omeprazole (PRILOSEC) 40 MG capsule Take 40 mg by mouth every morning.       Phenylephrine HCl (SINEX REGULAR NA) Place 1 spray into the nose daily as needed (congestion).      rosuvastatin (CRESTOR) 5 MG tablet Take 5 mg by mouth at bedtime.         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    Physical Exam  Gen: Alert, oriented. Appears stated age.  HEENT: PERRLA. Lungs: No respiratory distress CV: RRR Abd: soft, benign, no masses Ext: No edema    Planned procedures: Proceed with EGD/colonoscopy. The patient understands the nature of the planned procedure, indications, risks, alternatives and potential complications including but not limited to bleeding, infection, perforation, damage to internal organs and possible oversedation/side effects from anesthesia. The patient agrees and gives consent to proceed.  Please refer to procedure notes for findings, recommendations and patient disposition/instructions.     Regis Bill, MD [facility]

## 2023-04-01 NOTE — Anesthesia Postprocedure Evaluation (Signed)
Anesthesia Post Note  Patient: Sherry Vaughan  Procedure(s) Performed: COLONOSCOPY WITH PROPOFOL ESOPHAGOGASTRODUODENOSCOPY (EGD)  Patient location during evaluation: Endoscopy Anesthesia Type: General Level of consciousness: awake and alert Pain management: pain level controlled Vital Signs Assessment: post-procedure vital signs reviewed and stable Respiratory status: spontaneous breathing, nonlabored ventilation, respiratory function stable and patient connected to nasal cannula oxygen Cardiovascular status: blood pressure returned to baseline and stable Postop Assessment: no apparent nausea or vomiting Anesthetic complications: no   No notable events documented.   Last Vitals:  Vitals:   04/01/23 1031 04/01/23 1041  BP: 105/62 (!) 111/48  Pulse: 91 82  Resp: 16 16  Temp: (!) 36.1 C   SpO2: 96% 100%    Last Pain:  Vitals:   04/01/23 1041  TempSrc:   PainSc: 0-No pain                 Louie Boston

## 2023-04-01 NOTE — Transfer of Care (Signed)
Immediate Anesthesia Transfer of Care Note  Patient: Sherry Vaughan  Procedure(s) Performed: COLONOSCOPY WITH PROPOFOL ESOPHAGOGASTRODUODENOSCOPY (EGD)  Patient Location: PACU  Anesthesia Type:General  Level of Consciousness: awake, alert , and oriented  Airway & Oxygen Therapy: Patient Spontanous Breathing  Post-op Assessment: Report given to RN and Post -op Vital signs reviewed and stable  Post vital signs: Reviewed and stable  Last Vitals:  Vitals Value Taken Time  BP 105/62 04/01/23 1031  Temp 36.1 C 04/01/23 1031  Pulse 88 04/01/23 1033  Resp 12 04/01/23 1033  SpO2 96 % 04/01/23 1033  Vitals shown include unvalidated device data.  Last Pain:  Vitals:   04/01/23 1031  TempSrc: Tympanic  PainSc: Asleep         Complications: No notable events documented.

## 2023-04-01 NOTE — Interval H&P Note (Signed)
History and Physical Interval Note:  04/01/2023 10:04 AM  Sherry Vaughan  has presented today for surgery, with the diagnosis of h/o adenomatous polyp GERD.  The various methods of treatment have been discussed with the patient and family. After consideration of risks, benefits and other options for treatment, the patient has consented to  Procedure(s): COLONOSCOPY WITH PROPOFOL (N/A) ESOPHAGOGASTRODUODENOSCOPY (EGD) as a surgical intervention.  The patient's history has been reviewed, patient examined, no change in status, stable for surgery.  I have reviewed the patient's chart and labs.  Questions were answered to the patient's satisfaction.     Regis Bill  Ok to proceed with EGD/Colonoscopy

## 2023-04-02 ENCOUNTER — Encounter: Payer: Self-pay | Admitting: Gastroenterology

## 2023-04-02 LAB — SURGICAL PATHOLOGY

## 2023-04-24 ENCOUNTER — Encounter: Payer: Self-pay | Admitting: Gastroenterology

## 2023-11-13 IMAGING — MG MM DIGITAL SCREENING BILAT W/ TOMO AND CAD
8 series · 8 of 24 positions shown · non-contrast
Comparison: Previous exam(s).

CLINICAL DATA: Screening.

EXAM:
DIGITAL SCREENING BILATERAL MAMMOGRAM WITH TOMOSYNTHESIS AND CAD
TECHNIQUE: Bilateral screening digital craniocaudal and mediolateral oblique
mammograms were obtained. Bilateral screening digital breast
tomosynthesis was performed. The images were evaluated with
computer-aided detection.

[L CC synth-2D]
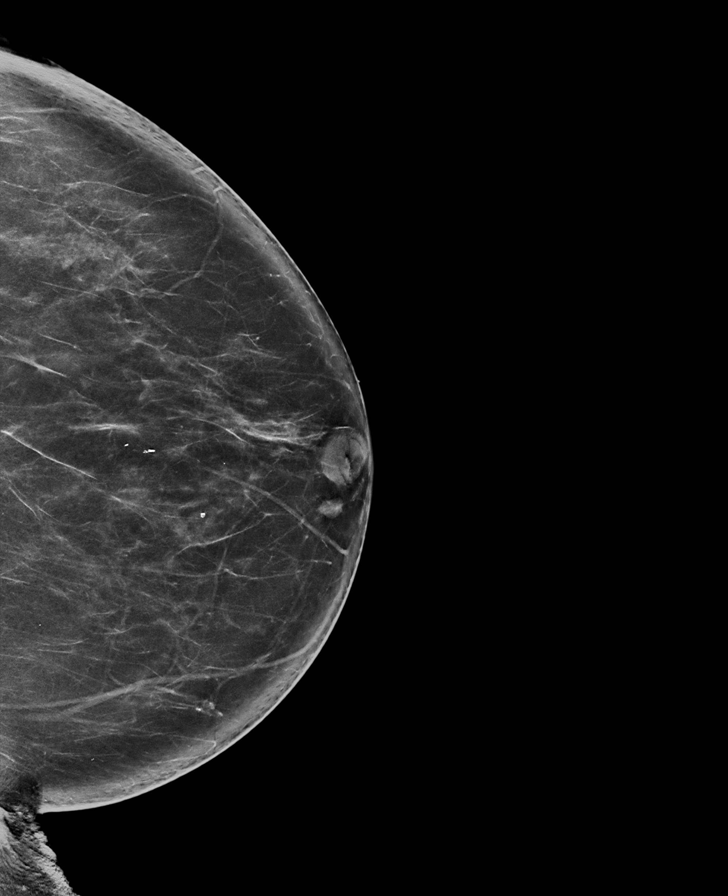

[R CC synth-2D]
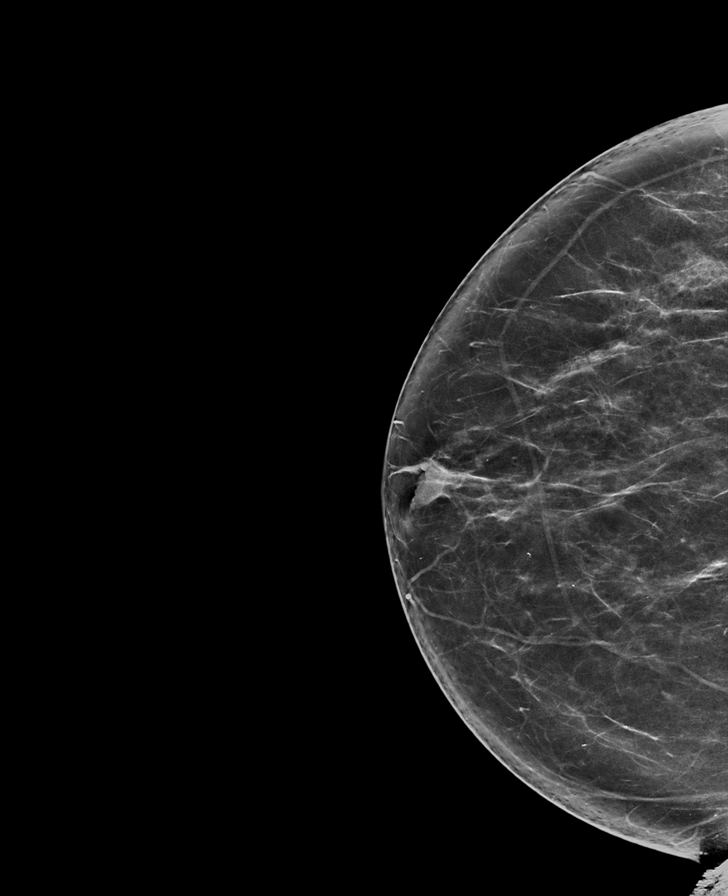

[R MLO synth-2D]
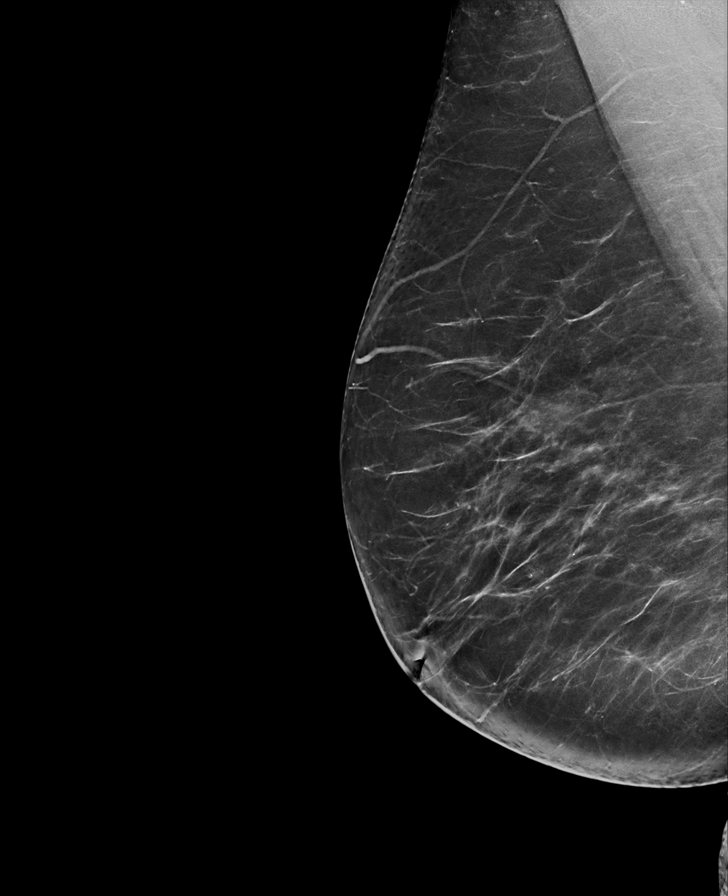

[L MLO synth-2D]
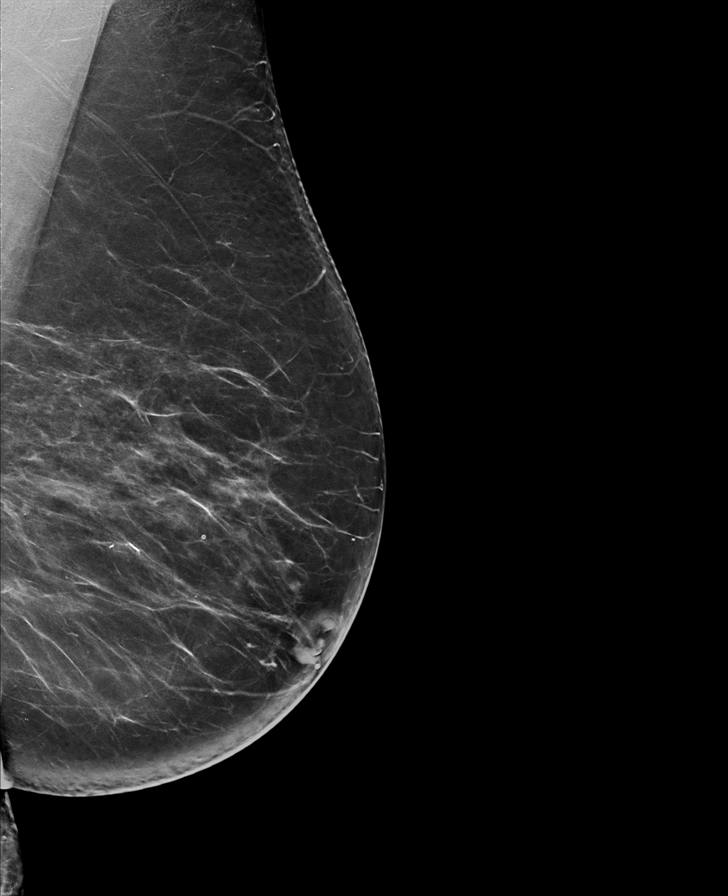

[L CC tomo · tomo slice 43/84.0]
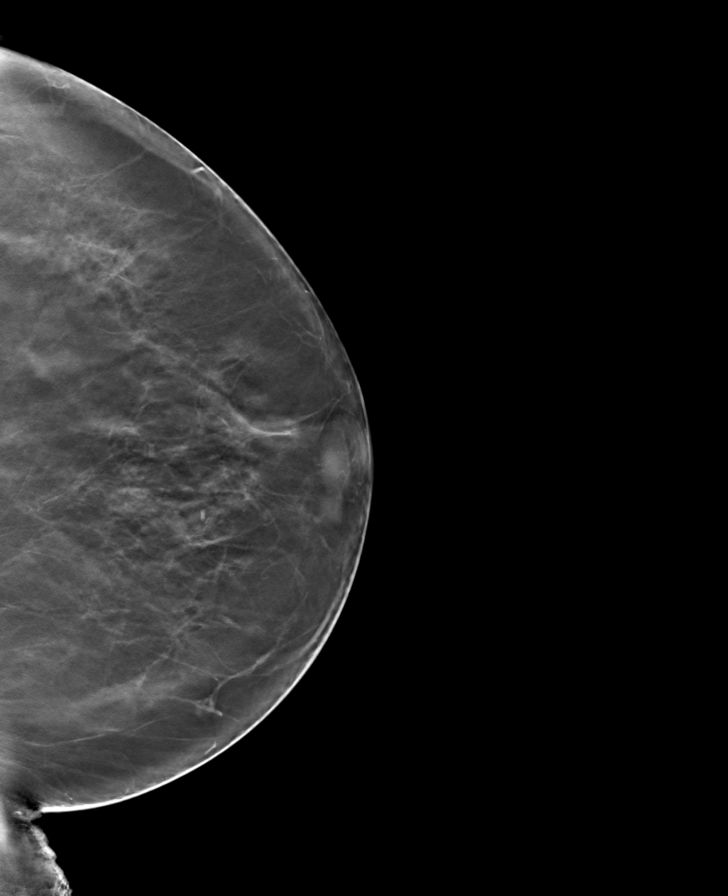

[L MLO tomo · tomo slice 43/86.0]
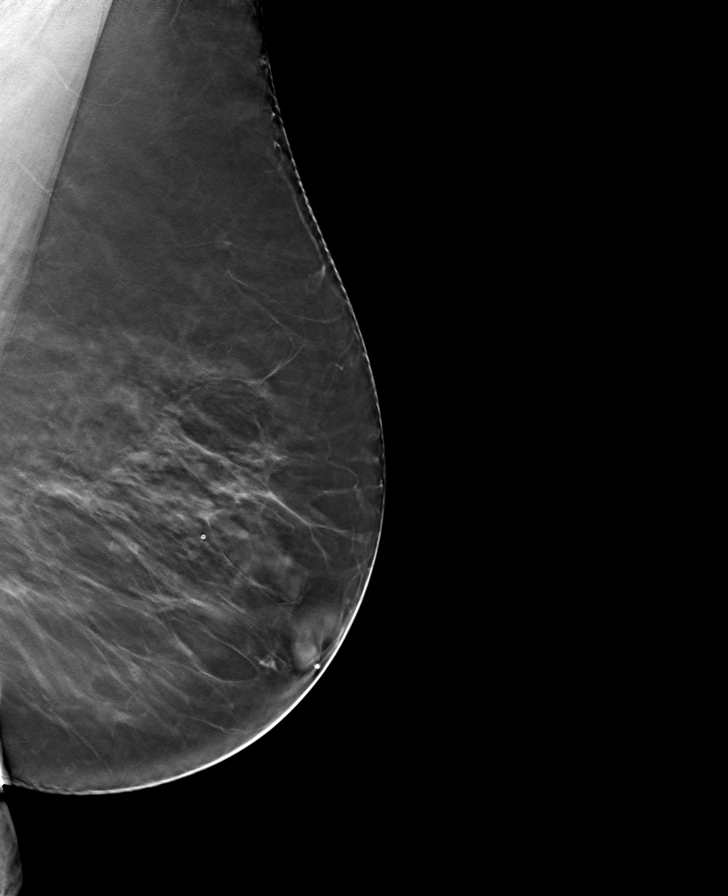

[R CC tomo · tomo slice 39/77.0]
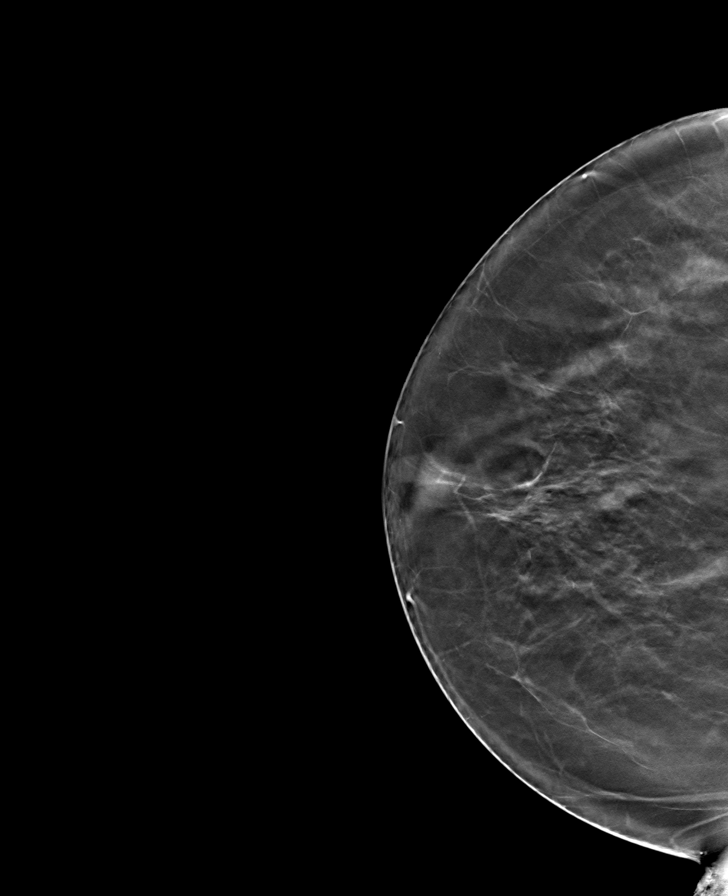

[R MLO tomo · tomo slice 43/86.0]
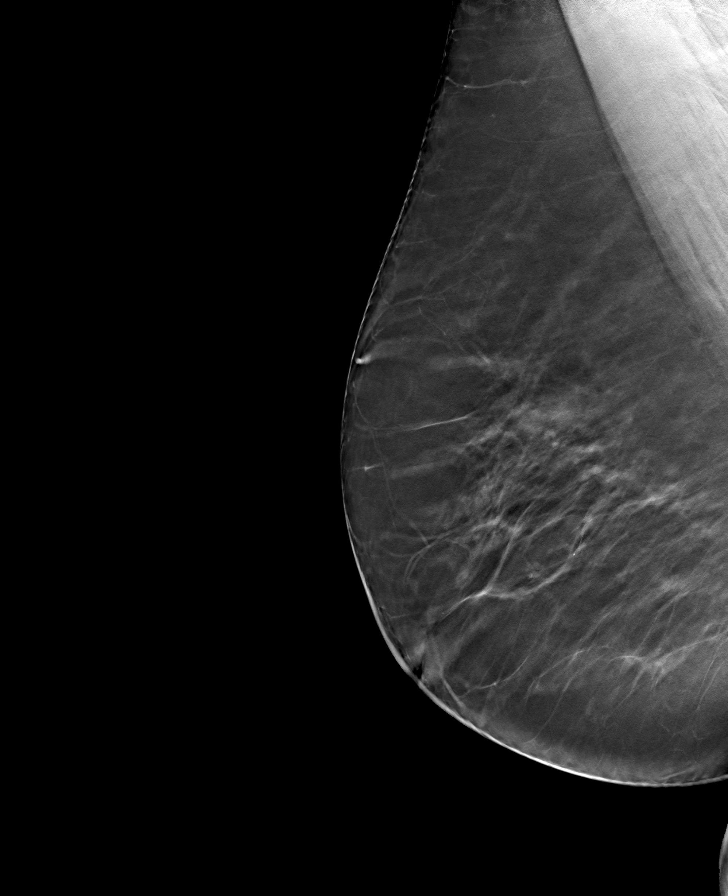

[8 of 24 positions shown; findings below may reference images not displayed]

ACR Breast Density Category b: There are scattered areas of
fibroglandular density.
FINDINGS: There are no findings suspicious for malignancy.
IMPRESSION: No mammographic evidence of malignancy. A result letter of this
screening mammogram will be mailed directly to the patient.

RECOMMENDATION:
Screening mammogram in one year. (Code:51-O-LD2)

BI-RADS CATEGORY  1: Negative.

## 2023-12-19 ENCOUNTER — Ambulatory Visit
Admission: RE | Admit: 2023-12-19 | Discharge: 2023-12-19 | Disposition: A | Discharge status: Home / Self Care | Payer: Medicare HMO | Source: Ambulatory Visit | Attending: Family Medicine | Admitting: Family Medicine

## 2023-12-19 VITALS — BP 141/74 | HR 68 | Temp 98.4°F | Resp 18

## 2023-12-19 DIAGNOSIS — J40 Bronchitis, not specified as acute or chronic: Secondary | ICD-10-CM | POA: Diagnosis not present

## 2023-12-19 DIAGNOSIS — Z87891 Personal history of nicotine dependence: Secondary | ICD-10-CM

## 2023-12-19 MED ORDER — PREDNISONE 10 MG (21) PO TBPK: ORAL_TABLET | Freq: Every day | ORAL | 0 refills | Status: AC

## 2023-12-19 MED ORDER — AZITHROMYCIN 250 MG PO TABS: ORAL_TABLET | ORAL | 0 refills | Status: AC

## 2023-12-19 NOTE — Discharge Instructions (Signed)
 Stop by the pharmacy to pick up your prescriptions.  If your cough does not improve, you develop a fever or you get short of breath, return to the urgent care or see your primary care doctor as you may need a chest x-ray.

## 2023-12-19 NOTE — ED Triage Notes (Signed)
 Pt cough, congestion x 5 days. Taking mucinex and Nyquil.

## 2023-12-19 NOTE — ED Provider Notes (Signed)
 [facility]    CSN: [id number] Arrival date & time: 12/19/23  1818      History   Chief Complaint Chief Complaint  Patient presents with   Cough    HPI Sherry Vaughan is a 78 y.o. female.   HPI  History obtained from the patient. Sherry Vaughan presents for cough that started Saturday night. Cough was dry and now its productive.  She got up this morning feeling worse. Has rhinorrhea and nasal congestion. Has been taking some homemade cough syrup.  Her son and girlfriend have been sick.  She was around her sick stepson who was treated for a bacterial lung infection. No wheezing or shortness of breath.   No history of asthma. She is a former smoker.       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   Past Surgical History:  Procedure Laterality Date   BROW LIFT Bilateral 12/03/2017   Procedure: BLEPHAROPLASTY;  Surgeon: Ashley Greig HERO, MD;  Location: [facility];  Service: Ophthalmology;  Laterality: Bilateral;   CATARACT EXTRACTION W/PHACO Right 05/04/2019   Procedure: CATARACT EXTRACTION PHACO AND INTRAOCULAR LENS PLACEMENT (IOC) RIGHT DIABETES DIET CONTROL ONLY;  Surgeon: Myrna Adine Anes, MD;  Location: [facility];  Service: Ophthalmology;  Laterality: Right;   CATARACT EXTRACTION W/PHACO Left 06/01/2019   Procedure: CATARACT EXTRACTION PHACO AND INTRAOCULAR LENS PLACEMENT (IOC)  LEFT;  Surgeon: Myrna Adine Anes, MD;  Location: [facility];  Service: Ophthalmology;  Laterality: Left;   COLONOSCOPY      COLONOSCOPY WITH PROPOFOL  N/A 03/04/2017   Procedure: COLONOSCOPY WITH PROPOFOL ;  Surgeon: Lamar ONEIDA Holmes, MD;  Location: [facility] ENDOSCOPY;  Service: Endoscopy;  Laterality: N/A;   COLONOSCOPY WITH PROPOFOL  N/A 04/01/2023   Procedure: COLONOSCOPY WITH PROPOFOL ;  Surgeon: Maryruth Ole ONEIDA, MD;  Location: [facility] ENDOSCOPY;  Service: Endoscopy;  Laterality: N/A;   ENDOMETRIAL FULGURATION     ESOPHAGOGASTRODUODENOSCOPY     ESOPHAGOGASTRODUODENOSCOPY  04/01/2023   Procedure: ESOPHAGOGASTRODUODENOSCOPY (EGD);  Surgeon: Maryruth Ole ONEIDA, MD;  Location: [facility] ENDOSCOPY;  Service: Endoscopy;;   ESOPHAGOGASTRODUODENOSCOPY (EGD) WITH PROPOFOL  N/A 03/04/2017   Procedure: ESOPHAGOGASTRODUODENOSCOPY (EGD) WITH PROPOFOL ;  Surgeon: Lamar ONEIDA Holmes, MD;  Location: [facility] ENDOSCOPY;  Service: Endoscopy;  Laterality: N/A;   EYE SURGERY     JOINT REPLACEMENT     KNEE ARTHROSCOPY Left 08/26/2015   Procedure: ARTHROSCOPY KNEE, partial medial meniscectomy and chondral debridement;  Surgeon: Helayne Glenn, MD;  Location: [facility];  Service: Orthopedics;  Laterality: Left;   TOTAL KNEE ARTHROPLASTY Right 10/11/2020   Procedure: TOTAL KNEE ARTHROPLASTY;  Surgeon: Edie Norleen PARAS, MD;  Location: [facility] ORS;  Service: Orthopedics;  Laterality: Right;    OB History   No obstetric history on file.      Home Medications    Prior to Admission medications   Medication Sig Start Date End Date Taking?  Authorizing Provider  aspirin EC 81 MG tablet Take 81 mg by mouth daily. Swallow whole.   Yes [provider]  azithromycin  (ZITHROMAX  Z-PAK) 250 MG tablet Take 2 tablets on day 1 then 1 tablet daily 12/19/23  Yes Rasool Rommel, DO  Carboxymethylcellul-Glycerin (LUBRICATING EYE DROPS OP) Place 1 drop into both eyes every evening.   Yes [provider]  Multiple Vitamin (MULTIVITAMIN WITH MINERALS) TABS tablet Take 1 tablet by mouth daily.   Yes [provider]  omeprazole (PRILOSEC) 40  MG capsule Take 40 mg by mouth every morning.    Yes [provider]  predniSONE  (STERAPRED UNI-PAK 21 TAB) 10 MG (21) TBPK tablet Take by mouth daily. Take 6 tabs by mouth daily for 1, then 5 tabs for 1 day, then 4 tabs for 1 day, then 3 tabs for 1 day, then 2 tabs for 1 day, then 1 tab for 1 day. 12/19/23  Yes Kerem Gilmer, DO  rosuvastatin (CRESTOR) 5 MG tablet Take 5 mg by mouth at bedtime.    Yes [provider]  acetaminophen  (TYLENOL ) 325 MG tablet Take 650 mg by mouth 2 (two) times daily as needed for moderate pain or headache.    [provider]  Ascorbic Acid (VITAMIN C) 1000 MG tablet Take 1,000 mg by mouth daily.    [provider]  celecoxib (CELEBREX) 200 MG capsule Take 200 mg by mouth daily. 08/22/21   [provider]  Cholecalciferol (VITAMIN D) 50 MCG (2000 UT) tablet Take 2,000 Units by mouth daily.    [provider]  ELIQUIS 2.5 MG TABS tablet Take 2.5 mg by mouth 2 (two) times daily. Patient not taking: Reported on 03/27/2023 10/07/20   [provider]  ipratropium (ATROVENT ) 0.06 % nasal spray Place 2 sprays into both nostrils 4 (four) times daily. 01/27/22   Bernardino Ditch, NP  Phenylephrine  HCl (SINEX REGULAR NA) Place 1 spray into the nose daily as needed (congestion).    [provider]    Family History Family History  Problem Relation Age of Onset   Heart attack Mother    Heart attack Father    Diabetes Sister    Diabetes Brother    Cancer Brother    Breast cancer Maternal Aunt    Breast cancer Paternal Aunt     Social History Social History   Tobacco Use   Smoking status: Former    Current packs/day: 0.00    Average packs/day: 2.0 packs/day for 20.0 years (40.0 ttl pk-yrs)    Types: Cigarettes    Start date: 12/17/1961    Quit date: 12/17/1981    Years since quitting: 42.0   Smokeless tobacco: Never  Vaping Use   Vaping status: Never Used  Substance Use Topics   Alcohol use: Yes     Comment: VERY RARE   Drug use: No     Allergies   Sulfa antibiotics and Zinc   Review of Systems Review of Systems: negative unless otherwise stated in HPI.      Physical Exam Triage Vital Signs ED Triage Vitals  Encounter Vitals Group     BP 12/19/23 1853 (!) 141/74     Systolic BP Percentile --      Diastolic BP Percentile --      Pulse Rate 12/19/23 1853 68     Resp 12/19/23 1853 18     Temp 12/19/23 1853 98.4 F (36.9 C)     Temp Source 12/19/23 1853 Oral  SpO2 12/19/23 1853 100 %     Weight --      Height --      Head Circumference --      Peak Flow --      Pain Score 12/19/23 1848 0     Pain Loc --      Pain Education --      Exclude from Growth Chart --    No data found.  Updated Vital Signs BP (!) 141/74 (BP Location: Left Arm)   Pulse 68   Temp 98.4 F (36.9 C) (Oral)   Resp 18   SpO2 100%   Visual Acuity Right Eye Distance:   Left Eye Distance:   Bilateral Distance:    Right Eye Near:   Left Eye Near:    Bilateral Near:     Physical Exam GEN:     alert, non-toxic appearing elderly female in no distress    HENT:  mucus membranes moist, clear nasal discharge EYES:   no scleral injection or discharge  RESP:  no increased work of breathing, coarse breath sounds bilaterally, no wheezing CVS:   regular rate and rhythm Skin:   warm and dry, no rash on visible skin    UC Treatments / Results  Labs (all labs ordered are listed, but only abnormal results are displayed) Labs Reviewed - No data to display  EKG   Radiology No results found.  Procedures Procedures (including critical care time)  Medications Ordered in UC Medications - No data to display  Initial Impression / Assessment and Plan / UC Course  I have reviewed the triage vital signs and the nursing notes.  Pertinent labs & imaging results that were available during my care of the patient were reviewed by me and considered in my medical decision making (see chart for  details).       Pt is a 78 y.o. female who presents for nearly a 1 week of cough that is not improving.  Telesia is  afebrile here without recent antipyretics. Satting well on room air. Overall pt is  non-toxic appearing, well hydrated, without respiratory distress. Pulmonary exam is remarkable for coarse breath sounds and productive cough.  After shared decision making, we will not pursue chest x-ray at this time.  She prefers to return for chest x-ray if she is not improving.  COVID  and influenza testing deferred due to length of symptoms.   Patient is a former smoker and I suspect that she has bronchitis.  She follows with pulmonology for chronic cough.  Treat acute bronchitis with steroids and antibiotics as below. Typical duration of symptoms discussed. Return and ED precautions given and patient voiced understanding.   Discussed MDM, treatment plan and plan for follow-up with patient who agrees with plan.      Final Clinical Impressions(s) / UC Diagnoses   Final diagnoses:  Bronchitis  Former smoker     Discharge Instructions      Stop by the pharmacy to pick up your prescriptions.  If your cough does not improve, you develop a fever or you get short of breath, return to the urgent care or see your primary care doctor as you may need a chest x-ray.      ED Prescriptions     Medication Sig Dispense Auth. Provider   azithromycin  (ZITHROMAX  Z-PAK) 250 MG tablet Take 2 tablets on day 1 then 1 tablet daily 6 tablet Mclain Freer, DO   predniSONE  (STERAPRED UNI-PAK 21 TAB) 10 MG (21)  TBPK tablet Take by mouth daily. Take 6 tabs by mouth daily for 1, then 5 tabs for 1 day, then 4 tabs for 1 day, then 3 tabs for 1 day, then 2 tabs for 1 day, then 1 tab for 1 day. 21 tablet Orpheus Hayhurst, DO      PDMP not reviewed this encounter.   Kriste Berth, DO 12/19/23 1939

## 2024-01-16 ENCOUNTER — Other Ambulatory Visit: Payer: Self-pay | Admitting: Family Medicine

## 2024-01-16 DIAGNOSIS — Z1231 Encounter for screening mammogram for malignant neoplasm of breast: Secondary | ICD-10-CM

## 2024-01-29 ENCOUNTER — Inpatient Hospital Stay: Admission: RE | Admit: 2024-01-29 | Payer: Medicare HMO | Source: Ambulatory Visit

## 2024-02-06 ENCOUNTER — Inpatient Hospital Stay: Admission: RE | Admit: 2024-02-06 | Payer: Medicare HMO | Source: Ambulatory Visit

## 2024-02-20 ENCOUNTER — Ambulatory Visit
Admission: RE | Admit: 2024-02-20 | Discharge: 2024-02-20 | Disposition: A | Discharge status: Home / Self Care | Payer: Medicare HMO | Source: Ambulatory Visit | Attending: Family Medicine | Admitting: Family Medicine

## 2024-02-20 DIAGNOSIS — Z1231 Encounter for screening mammogram for malignant neoplasm of breast: Secondary | ICD-10-CM | POA: Insufficient documentation
